# Patient Record
Sex: Female | Born: 1935 | Race: White | Hispanic: No | Smoking: Never smoker
Health system: Southern US, Community
[De-identification: ages and names within clinical notes are randomized; demographics above are authoritative.]

## PROBLEM LIST (undated history)

## (undated) DIAGNOSIS — F0390 Unspecified dementia without behavioral disturbance: Secondary | ICD-10-CM

## (undated) DIAGNOSIS — D649 Anemia, unspecified: Secondary | ICD-10-CM

---

## 2017-03-25 ENCOUNTER — Ambulatory Visit (INDEPENDENT_AMBULATORY_CARE_PROVIDER_SITE_OTHER): Payer: Medicare Other | Admitting: Podiatry

## 2017-03-25 DIAGNOSIS — M2042 Other hammer toe(s) (acquired), left foot: Secondary | ICD-10-CM | POA: Diagnosis not present

## 2017-03-25 DIAGNOSIS — L84 Corns and callosities: Secondary | ICD-10-CM

## 2017-03-25 DIAGNOSIS — M79609 Pain in unspecified limb: Secondary | ICD-10-CM

## 2017-03-25 DIAGNOSIS — B351 Tinea unguium: Secondary | ICD-10-CM | POA: Diagnosis not present

## 2017-03-25 NOTE — Progress Notes (Signed)
  Subjective:  Patient ID: Amanda Goodwin, female    DOB: 1925/05/13,  MRN: [id number]  Chief Complaint  Patient presents with  . Skin Problem    Lesion: L 2nd toe medial side x 6 mo; no pain Tx: corn pads  . debride    B/L debridement -no diabetic   84 y.o. female presents with the above complaint.  Reports painful callus lesion to the inside of the left second toe.  Has tried salicylic acid cream that caused a reaction.  Also complains of elongated leg thickened painful nails.  Normally sees a pedicurist but he says that the nails are too long to cut.  No past medical history on file.  Current Outpatient Medications:  .  aspirin EC 81 MG tablet, Take 81 mg by mouth daily., Disp: , Rfl:   Not on File Review of Systems Objective:  There were no vitals filed for this visit. General AA&O x3. Normal mood and affect.  Vascular Dorsalis pedis and posterior tibial pulses  present 2+ bilaterally  Capillary refill normal to all digits. Pedal hair growth normal.  Neurologic Epicritic sensation grossly present.  Dermatologic Left second DIPJ medial side hyperkeratotic lesion Interspaces clear of maceration. Nails x10 elongated thickened yellow discoloration crumbly texture subungual debris pain to palpation  Orthopedic: MMT 5/5 in dorsiflexion, plantarflexion, inversion, and eversion. Normal joint ROM without pain or crepitus.   Assessment & Plan:  Patient was evaluated and treated and all questions answered.  Onychomycosis with pain -Nails palliatively debrided as below  Procedure: Nail Debridement Type of Debridement: manual, sharp debridement. Instrumentation: Nail nipper, rotary burr. Number of Nails: 10  Hammertoe L 2nd Toe with DIPJ Callus -Lesion palliatively debrided -Corn pad cushion dispensed -Advised against using salicylic acid corn remover    Return if symptoms worsen or fail to improve.

## 2017-12-04 ENCOUNTER — Other Ambulatory Visit: Payer: Self-pay

## 2017-12-04 ENCOUNTER — Inpatient Hospital Stay (HOSPITAL_COMMUNITY): Payer: Medicare Other

## 2017-12-04 ENCOUNTER — Encounter (HOSPITAL_COMMUNITY): Payer: Self-pay | Admitting: Internal Medicine

## 2017-12-04 ENCOUNTER — Inpatient Hospital Stay (HOSPITAL_COMMUNITY)
Admission: AD | Admit: 2017-12-04 | Discharge: 2017-12-06 | DRG: 175 | Disposition: A | Discharge status: Home Health | Payer: Medicare Other | Source: Other Acute Inpatient Hospital | Attending: Internal Medicine | Admitting: Internal Medicine

## 2017-12-04 DIAGNOSIS — M7989 Other specified soft tissue disorders: Secondary | ICD-10-CM

## 2017-12-04 DIAGNOSIS — I361 Nonrheumatic tricuspid (valve) insufficiency: Secondary | ICD-10-CM | POA: Diagnosis not present

## 2017-12-04 DIAGNOSIS — G8929 Other chronic pain: Secondary | ICD-10-CM | POA: Diagnosis present

## 2017-12-04 DIAGNOSIS — R5381 Other malaise: Secondary | ICD-10-CM | POA: Diagnosis present

## 2017-12-04 DIAGNOSIS — I824Z3 Acute embolism and thrombosis of unspecified deep veins of distal lower extremity, bilateral: Secondary | ICD-10-CM | POA: Diagnosis present

## 2017-12-04 DIAGNOSIS — F0390 Unspecified dementia without behavioral disturbance: Secondary | ICD-10-CM | POA: Diagnosis present

## 2017-12-04 DIAGNOSIS — N179 Acute kidney failure, unspecified: Secondary | ICD-10-CM | POA: Diagnosis present

## 2017-12-04 DIAGNOSIS — Z7982 Long term (current) use of aspirin: Secondary | ICD-10-CM

## 2017-12-04 DIAGNOSIS — M25559 Pain in unspecified hip: Secondary | ICD-10-CM | POA: Diagnosis present

## 2017-12-04 DIAGNOSIS — Z833 Family history of diabetes mellitus: Secondary | ICD-10-CM | POA: Diagnosis not present

## 2017-12-04 DIAGNOSIS — J189 Pneumonia, unspecified organism: Secondary | ICD-10-CM | POA: Diagnosis present

## 2017-12-04 DIAGNOSIS — S32402D Unspecified fracture of left acetabulum, subsequent encounter for fracture with routine healing: Secondary | ICD-10-CM

## 2017-12-04 DIAGNOSIS — W19XXXD Unspecified fall, subsequent encounter: Secondary | ICD-10-CM | POA: Diagnosis present

## 2017-12-04 DIAGNOSIS — S32591D Other specified fracture of right pubis, subsequent encounter for fracture with routine healing: Secondary | ICD-10-CM

## 2017-12-04 DIAGNOSIS — I2609 Other pulmonary embolism with acute cor pulmonale: Secondary | ICD-10-CM | POA: Diagnosis not present

## 2017-12-04 DIAGNOSIS — R7989 Other specified abnormal findings of blood chemistry: Secondary | ICD-10-CM | POA: Diagnosis present

## 2017-12-04 DIAGNOSIS — I2699 Other pulmonary embolism without acute cor pulmonale: Secondary | ICD-10-CM | POA: Diagnosis present

## 2017-12-04 DIAGNOSIS — D649 Anemia, unspecified: Secondary | ICD-10-CM | POA: Diagnosis present

## 2017-12-04 HISTORY — DX: Anemia, unspecified: D64.9

## 2017-12-04 HISTORY — DX: Unspecified dementia, unspecified severity, without behavioral disturbance, psychotic disturbance, mood disturbance, and anxiety: F03.90

## 2017-12-04 LAB — COMPREHENSIVE METABOLIC PANEL
ALBUMIN: 2.8 g/dL — AB (ref 3.5–5.0)
ALT: 11 U/L (ref 0–44)
AST: 29 U/L (ref 15–41)
Alkaline Phosphatase: 172 U/L — ABNORMAL HIGH (ref 38–126)
Anion gap: 9 (ref 5–15)
BUN: 17 mg/dL (ref 8–23)
CALCIUM: 8.3 mg/dL — AB (ref 8.9–10.3)
CHLORIDE: 106 mmol/L (ref 98–111)
CO2: 24 mmol/L (ref 22–32)
CREATININE: 1.19 mg/dL — AB (ref 0.44–1.00)
GFR calc non Af Amer: 40 mL/min — ABNORMAL LOW (ref >60)
GFR, EST AFRICAN AMERICAN: 47 mL/min — AB (ref >60)
Glucose, Bld: 116 mg/dL — ABNORMAL HIGH (ref 70–99)
Potassium: 3.4 mmol/L — ABNORMAL LOW (ref 3.5–5.1)
SODIUM: 139 mmol/L (ref 135–145)
TOTAL PROTEIN: 5.9 g/dL — AB (ref 6.5–8.1)
Total Bilirubin: 0.9 mg/dL (ref 0.3–1.2)

## 2017-12-04 LAB — INFLUENZA PANEL BY PCR (TYPE A & B)
Influenza A By PCR: NEGATIVE
Influenza B By PCR: NEGATIVE

## 2017-12-04 LAB — TROPONIN I
Troponin I: 0.06 ng/mL (ref <0.03)
Troponin I: 0.04 ng/mL (ref <0.03)

## 2017-12-04 LAB — CBC
HEMATOCRIT: 32.6 % — AB (ref 36.0–46.0)
HEMOGLOBIN: 10.1 g/dL — AB (ref 12.0–15.0)
MCH: 28.5 pg (ref 26.0–34.0)
MCHC: 31 g/dL (ref 30.0–36.0)
MCV: 92.1 fL (ref 80.0–100.0)
Platelets: 241 10*3/uL (ref 150–400)
RBC: 3.54 MIL/uL — AB (ref 3.87–5.11)
RDW: 14.3 % (ref 11.5–15.5)
WBC: 17.4 10*3/uL — ABNORMAL HIGH (ref 4.0–10.5)
nRBC: 0 % (ref 0.0–0.2)

## 2017-12-04 LAB — CBC WITH DIFFERENTIAL/PLATELET
ABS IMMATURE GRANULOCYTES: 0.23 10*3/uL — AB (ref 0.00–0.07)
BASOS ABS: 0.1 10*3/uL (ref 0.0–0.1)
BASOS PCT: 0 %
Eosinophils Absolute: 0 10*3/uL (ref 0.0–0.5)
Eosinophils Relative: 0 %
HCT: 34 % — ABNORMAL LOW (ref 36.0–46.0)
Hemoglobin: 10.3 g/dL — ABNORMAL LOW (ref 12.0–15.0)
IMMATURE GRANULOCYTES: 1 %
Lymphocytes Relative: 10 %
Lymphs Abs: 2.1 10*3/uL (ref 0.7–4.0)
MCH: 28.1 pg (ref 26.0–34.0)
MCHC: 30.3 g/dL (ref 30.0–36.0)
MCV: 92.6 fL (ref 80.0–100.0)
MONO ABS: 1.5 10*3/uL — AB (ref 0.1–1.0)
Monocytes Relative: 7 %
NEUTROS PCT: 82 %
NRBC: 0 % (ref 0.0–0.2)
Neutro Abs: 17.9 10*3/uL — ABNORMAL HIGH (ref 1.7–7.7)
PLATELETS: 251 10*3/uL (ref 150–400)
RBC: 3.67 MIL/uL — AB (ref 3.87–5.11)
RDW: 14.3 % (ref 11.5–15.5)
WBC: 21.8 10*3/uL — AB (ref 4.0–10.5)

## 2017-12-04 LAB — BASIC METABOLIC PANEL
Anion gap: 8 (ref 5–15)
BUN: 13 mg/dL (ref 8–23)
CHLORIDE: 106 mmol/L (ref 98–111)
CO2: 25 mmol/L (ref 22–32)
Calcium: 8.4 mg/dL — ABNORMAL LOW (ref 8.9–10.3)
Creatinine, Ser: 0.95 mg/dL (ref 0.44–1.00)
GFR calc Af Amer: >60 mL/min (ref >60)
GFR calc non Af Amer: 53 mL/min — ABNORMAL LOW (ref >60)
Glucose, Bld: 94 mg/dL (ref 70–99)
POTASSIUM: 3.6 mmol/L (ref 3.5–5.1)
Sodium: 139 mmol/L (ref 135–145)

## 2017-12-04 LAB — HEPARIN LEVEL (UNFRACTIONATED)
HEPARIN UNFRACTIONATED: 0.31 [IU]/mL (ref 0.30–0.70)
HEPARIN UNFRACTIONATED: 0.26 [IU]/mL — AB (ref 0.30–0.70)

## 2017-12-04 LAB — ECHOCARDIOGRAM COMPLETE
Height: 65 in
WEIGHTICAEL: 2370.39 [oz_av]

## 2017-12-04 LAB — STREP PNEUMONIAE URINARY ANTIGEN: Strep Pneumo Urinary Antigen: NEGATIVE

## 2017-12-04 LAB — TYPE AND SCREEN
ABO/RH(D): O POS
ANTIBODY SCREEN: NEGATIVE

## 2017-12-04 LAB — MRSA PCR SCREENING: MRSA by PCR: NEGATIVE

## 2017-12-04 LAB — ABO/RH: ABO/RH(D): O POS

## 2017-12-04 MED ORDER — HEPARIN (PORCINE) 25000 UT/250ML-% IV SOLN
1300.0000 [IU]/h | INTRAVENOUS | Status: DC
  Administered 2017-12-04: 1000 [IU]/h via INTRAVENOUS
  Administered 2017-12-05: 1300 [IU]/h via INTRAVENOUS
  Filled 2017-12-04 (×2): qty 250

## 2017-12-04 MED ORDER — MORPHINE SULFATE (PF) 2 MG/ML IV SOLN
0.5000 mg | Freq: Once | INTRAVENOUS | Status: AC
  Administered 2017-12-04: 0.5 mg via INTRAVENOUS
  Filled 2017-12-04: qty 1

## 2017-12-04 MED ORDER — SODIUM CHLORIDE 0.9 % IV SOLN
500.0000 mg | INTRAVENOUS | Status: DC
  Administered 2017-12-04 – 2017-12-05 (×2): 500 mg via INTRAVENOUS
  Filled 2017-12-04 (×2): qty 500

## 2017-12-04 MED ORDER — SODIUM CHLORIDE 0.9 % IV SOLN
1.0000 g | INTRAVENOUS | Status: DC
  Administered 2017-12-04 – 2017-12-05 (×2): 1 g via INTRAVENOUS
  Filled 2017-12-04 (×2): qty 10

## 2017-12-04 MED ORDER — ACETAMINOPHEN 325 MG PO TABS: 650.0000 mg | ORAL_TABLET | Freq: Four times a day (QID) | ORAL | Status: DC | PRN

## 2017-12-04 MED ORDER — ONDANSETRON HCL 4 MG PO TABS: 4.0000 mg | ORAL_TABLET | Freq: Four times a day (QID) | ORAL | Status: DC | PRN

## 2017-12-04 MED ORDER — ONDANSETRON HCL 4 MG/2ML IJ SOLN: 4.0000 mg | Freq: Four times a day (QID) | INTRAMUSCULAR | Status: DC | PRN

## 2017-12-04 MED ORDER — ACETAMINOPHEN 650 MG RE SUPP: 650.0000 mg | Freq: Four times a day (QID) | RECTAL | Status: DC | PRN

## 2017-12-04 MED ORDER — ASPIRIN EC 81 MG PO TBEC
81.0000 mg | DELAYED_RELEASE_TABLET | Freq: Every day | ORAL | Status: DC
  Administered 2017-12-05: 81 mg via ORAL
  Filled 2017-12-04 (×2): qty 1

## 2017-12-04 MED ORDER — SODIUM CHLORIDE 0.9 % IV SOLN
INTRAVENOUS | Status: AC
  Administered 2017-12-04: 07:00:00 via INTRAVENOUS

## 2017-12-04 NOTE — Progress Notes (Signed)
BLE venous duplex prelim: intramuscular DVT bilateral soleal veins.   Amanda Goodwin, RDMS, RVT

## 2017-12-04 NOTE — Progress Notes (Signed)
ANTICOAGULATION CONSULT NOTE - Follow-up Consult  Pharmacy Consult for Heparin Indication: pulmonary embolus  No Known Allergies  Patient Measurements: Height: 5\' 5"  (165.1 cm) Weight: 148 lb 2.4 oz (67.2 kg) IBW/kg (Calculated) : 57  Vital Signs: Temp: 99.7 F (37.6 C) (11/27 0745) Temp Source: Axillary (11/27 0745) BP: 112/50 (11/27 0745) Pulse Rate: 81 (11/27 0745)  Labs (at [facility]): WBC  20.6 Hgb  11.9 Hct  36.4 Plt  281  SCr  1.5 Recent Labs    12/04/17 0224 12/04/17 1034  HGB 10.3* 10.1*  HCT 34.0* 32.6*  PLT 251 241  HEPARINUNFRC  --  0.31  CREATININE 1.19* 0.95  TROPONINI 0.06* 0.04*    Estimated Creatinine Clearance: 35.4 mL/min (by C-G formula based on SCr of 0.95 mg/dL).   Medical History: HTN  GERD  Asthma  Dementia Recent acetabular fracture 11/19/17  Medications:  Lyrica  Prilosec ASA  Advair  Levaquin  Prednisone  Mobic    Assessment: 84 y.o. female transferred [facility] with PE.  Heparin level is therapeutic on 1000 units/hr.  No bleeding noted.  Goal of Therapy:  Heparin level 0.3-0.7 units/ml Monitor platelets by anticoagulation protocol: Yes   Plan:  Continue heparin 1000 units/hr. Check heparin level at 1600 for confirmation. Daily heparin level and CBC  Creedence Kunesh, Pharm.D., BCPS Clinical Pharmacist Pager: [phone] Clinical phone for 12/04/2017 from 8:30-4:00 is [phone].  **Pharmacist phone directory can now be found on [email or web address] ([email or web address]).  Listed under [facility].  12/04/2017 1:42 PM

## 2017-12-04 NOTE — Progress Notes (Signed)
CRITICAL VALUE ALERT  Critical Value: Troponin 0.06  Date & Time Notied: 12/04/17 0403  Provider Notified: Dr. Toniann FailKakrakandy  Orders Received/Actions taken: no new orders at this time

## 2017-12-04 NOTE — Progress Notes (Addendum)
ANTICOAGULATION CONSULT NOTE - Follow-up Consult  Pharmacy Consult for Heparin Indication: pulmonary embolus/DVT  No Known Allergies  Patient Measurements: Height: 5\' 5"  (165.1 cm) Weight: 148 lb 2.4 oz (67.2 kg) IBW/kg (Calculated) : 57  Heparin dosing weight - 67.2 kg  Vital Signs: Temp: 100.8 F (38.2 C) (11/27 1709) Temp Source: Oral (11/27 1709) BP: 98/49 (11/27 1709) Pulse Rate: 94 (11/27 1709)  Labs (at [facility]): WBC  20.6 Hgb  11.9 Hct  36.4 Plt  281  SCr  1.5 Recent Labs    12/04/17 0224 12/04/17 1034 12/04/17 1644  HGB 10.3* 10.1*  --   HCT 34.0* 32.6*  --   PLT 251 241  --   HEPARINUNFRC  --  0.31 0.26*  CREATININE 1.19* 0.95  --   TROPONINI 0.06* 0.04*  --     Estimated Creatinine Clearance: 35.4 mL/min (by C-G formula based on SCr of 0.95 mg/dL).   Medical History: HTN  GERD  Asthma  Dementia Recent acetabular fracture 11/19/17  Medications:  Lyrica  Prilosec ASA  Advair  Levaquin  Prednisone  Mobic    Assessment: 84 y.o. female transferred from [facility] with PE/DVT. Heparin level now slightly low at 0.26 this afternoon. Hg down to 10.1, plt stable wnl. No bleeding or issues with infusion per discussion with RN.  Goal of Therapy:  Heparin level 0.3-0.7 units/ml Monitor platelets by anticoagulation protocol: Yes   Plan:  Increase heparin to 1150 units/hr 6h heparin level Monitor daily heparin level and CBC, s/sx bleeding Plan is to transition to apixaban when appropriate per MD note  Babs BertinHaley Meeghan Skipper, PharmD, BCPS Clinical Pharmacist Clinical phone [phone] Please check AMION for all [facility] contact numbers 12/04/2017 5:29 PM

## 2017-12-04 NOTE — H&P (Signed)
History and Physical    Amanda PangMinnie Goodwin WNU:[id number] DOB: 09-Dec-1925 DOA: 12/04/2017  PCP: Gordan PaymentGrisso, Greg A., MD  Patient coming from: [facility] was [facility].  Chief Complaint: Shortness of breath.  HPI: Amanda PangMinnie Goodwin is a 84 y.o. female with history of advanced dementia who lives with her daughter recently diagnosed with left acetabular fracture after fall being managed conservatively was noticed to have increasing fever productive cough and dark urine last 2 days.  As per the daughter who provided the history patient was bedbound for last few days and started walking yesterday.  Following which patient started become short of breath with productive cough.  ED Course: In the ER patient had CT angiogram of the chest which showed positive for acute pulmonary embolism within the right descending lobar right upper right lower lobe segmental and subsegmental blood vessels positive for acute PE with CT evidence of right heart strain.  There also was concern for consolidation in the right lung base concerning for pneumonia.  UA showing leukocyte esterase positive with large number of RBCs WBCs and epithelial cells.  Since ED was showing strain pattern patient was [facility].  Patient has remained hemodynamically stable.  Per daughter patient has been complaining of increasing left hip pain after admission.  [facility] showed WBC count of 20.6 hemoglobin 11.9 creatinine 1.5.  Potassium 3.4.  Review of Systems: As per HPI, rest all negative.   Past Medical History:  Diagnosis Date  . Anemia   . Dementia (HCC)     History reviewed. No pertinent surgical history.   reports that she has never smoked. She has never used smokeless tobacco. She reports that she does not drink alcohol. Her drug history is not on file.  No Known Allergies  Family History  Problem Relation Age of Onset  . Diabetes Mellitus II Mother   . Cancer  Father   . Diabetes Mellitus II Sister     Prior to Admission medications   Medication Sig Start Date End Date Taking? Authorizing Provider  aspirin EC 81 MG tablet Take 81 mg by mouth daily.    [provider]    Physical Exam: Vitals:   12/04/17 0149 12/04/17 0408 12/04/17 0423 12/04/17 0432  BP: (!) 113/55 (!) 99/47 (!) 97/47 119/63  Pulse: 89     Resp: 20     Temp: 98.9 F (37.2 C)     TempSrc: Oral     SpO2: 95% 93% 95% 93%  Weight: 67.2 kg     Height: 5\' 5"  (1.651 m)         Constitutional: Moderately built and nourished. Vitals:   12/04/17 0149 12/04/17 0408 12/04/17 0423 12/04/17 0432  BP: (!) 113/55 (!) 99/47 (!) 97/47 119/63  Pulse: 89     Resp: 20     Temp: 98.9 F (37.2 C)     TempSrc: Oral     SpO2: 95% 93% 95% 93%  Weight: 67.2 kg     Height: 5\' 5"  (1.651 m)      Eyes: Anicteric no pallor. ENMT: No discharge from the ears eyes nose or mouth. Neck: No mass or.  No neck rigidity.  No JVD appreciated. Respiratory: No rhonchi or crepitations. Cardiovascular: S1-S2 heard no murmurs appreciated. Abdomen: Soft nontender bowel sounds present. Musculoskeletal: No edema. Skin: No rash. Neurologic: Patient is mildly drowsy after receiving morphine for pain.  Need to recheck neuro exam after patient is more alert awake.  Psychiatric: Mildly drowsy after getting morphine.   Labs on Admission: I have personally reviewed following labs and imaging studies  CBC: Recent Labs  Lab 12/04/17 0224  WBC 21.8*  NEUTROABS 17.9*  HGB 10.3*  HCT 34.0*  MCV 92.6  PLT 251   Basic Metabolic Panel: Recent Labs  Lab 12/04/17 0224  NA 139  K 3.4*  CL 106  CO2 24  GLUCOSE 116*  BUN 17  CREATININE 1.19*  CALCIUM 8.3*   GFR: Estimated Creatinine Clearance: 28.3 mL/min (A) (by C-G formula based on SCr of 1.19 mg/dL (H)). Liver Function Tests: Recent Labs  Lab 12/04/17 0224  AST 29  ALT 11  ALKPHOS 172*  BILITOT 0.9  PROT 5.9*  ALBUMIN 2.8*    No results for input(s): LIPASE, AMYLASE in the last 168 hours. No results for input(s): AMMONIA in the last 168 hours. Coagulation Profile: No results for input(s): INR, PROTIME in the last 168 hours. Cardiac Enzymes: Recent Labs  Lab 12/04/17 0224  TROPONINI 0.06*   BNP (last 3 results) No results for input(s): PROBNP in the last 8760 hours. HbA1C: No results for input(s): HGBA1C in the last 72 hours. CBG: No results for input(s): GLUCAP in the last 168 hours. Lipid Profile: No results for input(s): CHOL, HDL, LDLCALC, TRIG, CHOLHDL, LDLDIRECT in the last 72 hours. Thyroid Function Tests: No results for input(s): TSH, T4TOTAL, FREET4, T3FREE, THYROIDAB in the last 72 hours. Anemia Panel: No results for input(s): VITAMINB12, FOLATE, FERRITIN, TIBC, IRON, RETICCTPCT in the last 72 hours. Urine analysis: No results found for: COLORURINE, APPEARANCEUR, LABSPEC, PHURINE, GLUCOSEU, HGBUR, BILIRUBINUR, KETONESUR, PROTEINUR, UROBILINOGEN, NITRITE, LEUKOCYTESUR Sepsis Labs: @LABRCNTIP (procalcitonin:4,lacticidven:4) ) Recent Results (from the past 240 hour(s))  MRSA PCR Screening     Status: None   Collection Time: 12/04/17  2:15 AM  Result Value Ref Range Status   MRSA by PCR NEGATIVE NEGATIVE Final    Comment:        The GeneXpert MRSA Assay (FDA approved for NASAL specimens only), is one component of a comprehensive MRSA colonization surveillance program. It is not intended to diagnose MRSA infection nor to guide or monitor treatment for MRSA infections. Performed at [facility], [address] [place], [place] [address]      Radiological Exams on Admission: No results found.  EKG: Independently reviewed.  Normal sinus rhythm.  Assessment/Plan Principal Problem:   Acute pulmonary embolism (HCC) Active Problems:   CAP (community acquired pneumonia)   Hip pain   Normocytic normochromic anemia   Dementia (HCC)   Acute pulmonary embolus (HCC)     1. Acute pulmonary embolism with strain pattern hemodynamically stable likely provoked from recent fracture and bedrest -patient has been placed on heparin.  We will cycle cardiac markers.  Discussed with pulmonary critical care about troponin mildly positive but patient is hemodynamically stable.  If patient's troponin X to increase or patient becomes hypotensive will need to reconsult pulmonary critical care for possible thrombolysis.  Check 2D echo and Dopplers of the lower extremity. 2. Increasing pain of the left hip after recent fracture will check CT pelvis. 3. Community-acquired pneumonia placed on ceftriaxone and Zithromax.  Check urine for Legionella strep antigen influenza PCR. 4. Anemia follow CBC. 5. Advanced dementia with no behavioral disturbances. 6. Chronic pain on Lyrica. 7. Renal failure probably chronic do not have any baseline creatinine to compare.  Follow metabolic panel.   DVT prophylaxis: Heparin. Code Status: Full code. Family Communication: Patient's daughter. Disposition Plan:  Home. Consults called: None.  Discussed with pulmonary critical care. Admission status: Inpatient.   Eduard Clos MD [facility] Pager [phone].  If 7PM-7AM, please contact night-coverage [email or web address] Password [facility]  12/04/2017, 4:58 AM

## 2017-12-04 NOTE — Progress Notes (Signed)
  Echocardiogram 2D Echocardiogram has been performed.  Amanda SkeenVijay  [name] 12/04/2017, 12:14 PM

## 2017-12-04 NOTE — Progress Notes (Signed)
PROGRESS NOTE        PATIENT DETAILS Name: Amanda Goodwin Age: 84 y.o. Sex: female Date of Birth: 03-25-1925 Admit Date: 12/04/2017 Admitting Physician Eduard Clos, MD [facility]:QIONGE, Evlyn Courier., MD  Brief Narrative: Patient is a 84 y.o. female history of dementia, recent left acetabular fracture managed conservatively-presenting with shortness of breath, found to have a pulmonary embolism-started on anticoagulation admitted to the hospitalist service.  See below for further details  Subjective: Answers all my questions appropriately this morning-denies any chest pain or shortness of breath.  Lying comfortably in bed.  Assessment/Plan: Venous thromboembolism-PE with bilateral lower extremity DVT: Likely provoked-probably secondary to decreased mobility following left acetabular fracture.  Although CT angiogram chest showed possible the strain-she is stable hemodynamically and does not have any clinical features suggestive of RV strain.  Plans are to continue IV heparin-and transition to Eliquis in the next day or so.  Await echocardiogram.  Community-acquired pneumonia: Afebrile-does not appear acutely sick-continue Rocephin and Zithromax.  Influenza PCR negative.  Mild AKI: Likely hemodynamically mediated-resolved.  Minimally elevated troponin: Trend is flat and not suggestive of ACS.  Likely secondary to PE.  No further work-up required  Recent left acetabular fracture/left pubic fracture: Supportive care-ambulate with PT.  Advanced dementia: Pleasantly confused.  DVT Prophylaxis: Full dose anticoagulation with Heparin  Code Status: Full code  Family Communication: None at bedside  Disposition Plan: Remain inpatient  Antimicrobial agents: Anti-infectives (From admission, onward)   Start     Dose/Rate Route Frequency Ordered Stop   12/04/17 1800  cefTRIAXone (ROCEPHIN) 1 g in sodium chloride 0.9 % 100 mL IVPB     1 g 200 mL/hr over 30  Minutes Intravenous Every 24 hours 12/04/17 0458 12/11/17 1759   12/04/17 1800  azithromycin (ZITHROMAX) 500 mg in sodium chloride 0.9 % 250 mL IVPB     500 mg 250 mL/hr over 60 Minutes Intravenous Every 24 hours 12/04/17 0458 12/11/17 1759      Procedures: None  CONSULTS:  None  Time spent: 25- minutes-Greater than 50% of this time was spent in counseling, explanation of diagnosis, planning of further management, and coordination of care.  MEDICATIONS: Scheduled Meds: . aspirin EC  81 mg Oral Daily   Continuous Infusions: . sodium chloride 75 mL/hr at 12/04/17 0646  . azithromycin    . cefTRIAXone (ROCEPHIN)  IV    . heparin 1,000 Units/hr (12/04/17 0303)   PRN Meds:.acetaminophen **OR** acetaminophen, ondansetron **OR** ondansetron (ZOFRAN) IV   PHYSICAL EXAM: Vital signs: Vitals:   12/04/17 0423 12/04/17 0432 12/04/17 0630 12/04/17 0745  BP: (!) 97/47 119/63 132/62 (!) 112/50  Pulse:    81  Resp:    15  Temp:    99.7 F (37.6 C)  TempSrc:    Axillary  SpO2: 95% 93% 100% 93%  Weight:      Height:       Filed Weights   12/04/17 0149  Weight: 67.2 kg   Body mass index is 24.65 kg/m.   General appearance :Awake, alert, not in any distress.  Eyes:.Pink conjunctiva HEENT: Atraumatic and Normocephalic Neck: supple, no JVD. Resp:Good air entry bilaterally, no added sounds  CVS: S1 S2 regular, no murmurs.  GI: Bowel sounds present, Non tender and not distended with no gaurding, rigidity or rebound.No organomegaly Extremities: B/L Lower Ext shows no edema, both legs are  warm to touch Neurology:  Non focal Musculoskeletal:No digital cyanosis Skin:No Rash, warm and dry Wounds:N/A  I have personally reviewed following labs and imaging studies  LABORATORY DATA: CBC: Recent Labs  Lab 12/04/17 0224 12/04/17 1034  WBC 21.8* 17.4*  NEUTROABS 17.9*  --   HGB 10.3* 10.1*  HCT 34.0* 32.6*  MCV 92.6 92.1  PLT 251 241    Basic Metabolic Panel: Recent Labs    Lab 12/04/17 0224 12/04/17 1034  NA 139 139  K 3.4* 3.6  CL 106 106  CO2 24 25  GLUCOSE 116* 94  BUN 17 13  CREATININE 1.19* 0.95  CALCIUM 8.3* 8.4*    GFR: Estimated Creatinine Clearance: 35.4 mL/min (by C-G formula based on SCr of 0.95 mg/dL).  Liver Function Tests: Recent Labs  Lab 12/04/17 0224  AST 29  ALT 11  ALKPHOS 172*  BILITOT 0.9  PROT 5.9*  ALBUMIN 2.8*   No results for input(s): LIPASE, AMYLASE in the last 168 hours. No results for input(s): AMMONIA in the last 168 hours.  Coagulation Profile: No results for input(s): INR, PROTIME in the last 168 hours.  Cardiac Enzymes: Recent Labs  Lab 12/04/17 0224 12/04/17 1034  TROPONINI 0.06* 0.04*    BNP (last 3 results) No results for input(s): PROBNP in the last 8760 hours.  HbA1C: No results for input(s): HGBA1C in the last 72 hours.  CBG: No results for input(s): GLUCAP in the last 168 hours.  Lipid Profile: No results for input(s): CHOL, HDL, LDLCALC, TRIG, CHOLHDL, LDLDIRECT in the last 72 hours.  Thyroid Function Tests: No results for input(s): TSH, T4TOTAL, FREET4, T3FREE, THYROIDAB in the last 72 hours.  Anemia Panel: No results for input(s): VITAMINB12, FOLATE, FERRITIN, TIBC, IRON, RETICCTPCT in the last 72 hours.  Urine analysis: No results found for: COLORURINE, APPEARANCEUR, LABSPEC, PHURINE, GLUCOSEU, HGBUR, BILIRUBINUR, KETONESUR, PROTEINUR, UROBILINOGEN, NITRITE, LEUKOCYTESUR  Sepsis Labs: Lactic Acid, Venous No results found for: LATICACIDVEN  MICROBIOLOGY: Recent Results (from the past 240 hour(s))  MRSA PCR Screening     Status: None   Collection Time: 12/04/17  2:15 AM  Result Value Ref Range Status   MRSA by PCR NEGATIVE NEGATIVE Final    Comment:        The GeneXpert MRSA Assay (FDA approved for NASAL specimens only), is one component of a comprehensive MRSA colonization surveillance program. It is not intended to diagnose MRSA infection nor to guide  or monitor treatment for MRSA infections. Performed at [facility], [address] [place], [place] [address]     RADIOLOGY STUDIES/RESULTS: Ct Pelvis Wo Contrast  Result Date: 12/04/2017 CLINICAL DATA:  Pelvic fractures, pelvic pain EXAM: CT PELVIS WITHOUT CONTRAST TECHNIQUE: Multidetector CT imaging of the pelvis was performed following the standard protocol without intravenous contrast. COMPARISON:  11/19/2017 FINDINGS: Urinary Tract: The ureters are opacified with contrast material and decompressed, normal caliber. Urinary bladder filled with contrast and appears normal. Bowel: Normal appendix. Visualized large and small bowel unremarkable. Vascular/Lymphatic: Diffuse aortic and iliac calcifications. No aneurysm or adenopathy. Reproductive:  Uterus and adnexa unremarkable.  No mass. Other:  No free fluid or free air. Musculoskeletal: Again noted are the acute fractures in the left inferior pubic ramus, left lateral superior pubic ramus near the left acetabulum, and left sacral ala. Old right sacral and right inferior pubic rami fractures again noted. Appearance is stable with compared to recent study. The previously seen hematoma in the left pelvis has nearly completely resolved. No new  acute bony abnormality. IMPRESSION: Stable appearance of the acute fractures in the left sacrum, left lateral superior pubic ramus near the acetabulum, and left inferior pubic ramus fracture. Remote fractures in the right sacrum and right inferior pubic ramus. No new bony abnormality. Near complete resolution of the left pelvic hematoma. Electronically Signed   By: Charlett Nose M.D.   On: 12/04/2017 09:31   Vas [place] Lower Extremity Venous (dvt)  Result Date: 12/04/2017  Lower Venous Study Indications: Swelling.  Risk Factors: Confirmed PE. Performing Technologist: Farrel Demark RDMS, RVT  Examination Guidelines: A complete evaluation includes B-mode imaging, spectral Doppler, color Doppler, and power Doppler  as needed of all accessible portions of each vessel. Bilateral testing is considered an integral part of a complete examination. Limited examinations for reoccurring indications may be performed as noted.  Right Venous Findings: +---------+---------------+---------+-----------+----------+-------+          CompressibilityPhasicitySpontaneityPropertiesSummary +---------+---------------+---------+-----------+----------+-------+ CFV      Full           Yes      Yes                          +---------+---------------+---------+-----------+----------+-------+ SFJ      Full                                                 +---------+---------------+---------+-----------+----------+-------+ FV Prox  Full                                                 +---------+---------------+---------+-----------+----------+-------+ FV Mid   Full                                                 +---------+---------------+---------+-----------+----------+-------+ FV DistalFull                                                 +---------+---------------+---------+-----------+----------+-------+ PFV      Full                                                 +---------+---------------+---------+-----------+----------+-------+ POP      Full           Yes      Yes                          +---------+---------------+---------+-----------+----------+-------+ PTV      Full                                                 +---------+---------------+---------+-----------+----------+-------+ PERO     Full                                                 +---------+---------------+---------+-----------+----------+-------+  Soleal   None                                         Acute   +---------+---------------+---------+-----------+----------+-------+  Left Venous Findings: +---------+---------------+---------+-----------+----------+-------+           CompressibilityPhasicitySpontaneityPropertiesSummary +---------+---------------+---------+-----------+----------+-------+ CFV      Full           Yes      Yes                          +---------+---------------+---------+-----------+----------+-------+ SFJ      Full                                                 +---------+---------------+---------+-----------+----------+-------+ FV Prox  Full                                                 +---------+---------------+---------+-----------+----------+-------+ FV Mid   Full                                                 +---------+---------------+---------+-----------+----------+-------+ FV DistalFull                                                 +---------+---------------+---------+-----------+----------+-------+ PFV      Full                                                 +---------+---------------+---------+-----------+----------+-------+ POP      Full           Yes      Yes                          +---------+---------------+---------+-----------+----------+-------+ PTV      Full                                                 +---------+---------------+---------+-----------+----------+-------+ PERO     Full                                                 +---------+---------------+---------+-----------+----------+-------+ Soleal   None                                         Acute   +---------+---------------+---------+-----------+----------+-------+    Summary: Right: Findings  consistent with acute deep vein thrombosis involving the right soleal vein. A cystic structure is found in the popliteal fossa. Left: Findings consistent with acute deep vein thrombosis involving the left soleal vein. No cystic structure found in the popliteal fossa.  *See table(s) above for measurements and observations. Electronically signed by Coral Else MD on 12/04/2017 at 10:34:27 AM.    Final       LOS: 0 days   Jeoffrey Massed, MD  [facility]  If 7PM-7AM, please contact night-coverage  Please page via [email or web address] [email or web address]-click on MD name and type text message  12/04/2017, 1:15 PM

## 2017-12-04 NOTE — Progress Notes (Signed)
ANTICOAGULATION CONSULT NOTE - Initial Consult  Pharmacy Consult for Heparin Indication: pulmonary embolus  No Known Allergies  Patient Measurements: Height: 5\' 5"  (165.1 cm) Weight: 148 lb 2.4 oz (67.2 kg) IBW/kg (Calculated) : 57  Vital Signs: Temp: 98.9 F (37.2 C) (11/27 0149) Temp Source: Oral (11/27 0149) BP: 113/55 (11/27 0149) Pulse Rate: 89 (11/27 0149)  [facility] WBC  20.6 Hgb  11.9 Hct  36.4 Plt  281  SCr  1.5 No results for input(s): HGB, HCT, PLT, APTT, LABPROT, INR, HEPARINUNFRC, HEPRLOWMOCWT, CREATININE, CKTOTAL, CKMB, TROPONINI in the last 72 hours.  CrCl cannot be calculated (No successful lab value found.).   Medical History: Htn  GERD  Asthma  Dementia Recent acetabular fracture 11/19/17  Medications:  Lyrica  Prilosec ASA  Advair  Levaquin  Prednisone  Mobic    Assessment: 84 y.o. female with PE for heparin.  Heparin 3000 units IV bolus, 1000 units/hr started at [facility] at 2200  Goal of Therapy:  Heparin level 0.3-0.7 units/ml Monitor platelets by anticoagulation protocol: Yes   Plan:  Continue heparin 1000 units/hr Follow-up am labs.   Sarrah Fiorenza, Gary FleetGregory Vernon 12/04/2017,2:44 AM

## 2017-12-05 LAB — CBC
HCT: 30.2 % — ABNORMAL LOW (ref 36.0–46.0)
Hemoglobin: 9.7 g/dL — ABNORMAL LOW (ref 12.0–15.0)
MCH: 29.2 pg (ref 26.0–34.0)
MCHC: 32.1 g/dL (ref 30.0–36.0)
MCV: 91 fL (ref 80.0–100.0)
PLATELETS: 229 10*3/uL (ref 150–400)
RBC: 3.32 MIL/uL — ABNORMAL LOW (ref 3.87–5.11)
RDW: 14.3 % (ref 11.5–15.5)
WBC: 7.5 10*3/uL (ref 4.0–10.5)
nRBC: 0 % (ref 0.0–0.2)

## 2017-12-05 LAB — HEPARIN LEVEL (UNFRACTIONATED)
HEPARIN UNFRACTIONATED: 0.43 [IU]/mL (ref 0.30–0.70)
HEPARIN UNFRACTIONATED: 0.26 [IU]/mL — AB (ref 0.30–0.70)

## 2017-12-05 LAB — LEGIONELLA PNEUMOPHILA SEROGP 1 UR AG: L. pneumophila Serogp 1 Ur Ag: NEGATIVE

## 2017-12-05 MED ORDER — APIXABAN 5 MG PO TABS: 5.0000 mg | ORAL_TABLET | Freq: Two times a day (BID) | ORAL | Status: DC

## 2017-12-05 MED ORDER — APIXABAN 5 MG PO TABS
10.0000 mg | ORAL_TABLET | Freq: Two times a day (BID) | ORAL | Status: DC
  Administered 2017-12-05 – 2017-12-06 (×3): 10 mg via ORAL
  Filled 2017-12-05 (×3): qty 2

## 2017-12-05 NOTE — Progress Notes (Signed)
ANTICOAGULATION CONSULT NOTE  Pharmacy Consult for Eliquis Indication: pulmonary embolus/DVT  No Known Allergies  Patient Measurements: Height: 5\' 5"  (165.1 cm) Weight: 149 lb (67.6 kg) IBW/kg (Calculated) : 57   Vital Signs: Temp: 99.5 F (37.5 C) (11/28 0441) Temp Source: Oral (11/28 0441) BP: 112/67 (11/28 0342) Pulse Rate: 77 (11/28 0342)  Labs: Recent Labs    12/04/17 0224 12/04/17 1034 12/04/17 1644 12/05/17 0019  HGB 10.3* 10.1*  --   --   HCT 34.0* 32.6*  --   --   PLT 251 241  --   --   HEPARINUNFRC  --  0.31 0.26* 0.26*  CREATININE 1.19* 0.95  --   --   TROPONINI 0.06* 0.04*  --   --     Estimated Creatinine Clearance: 35.4 mL/min (by C-G formula based on SCr of 0.95 mg/dL).  Assessment: 84 y.o. female with PE/DVT for Eliquis   Plan:  D/C heparin now Eliquis 10 mg BID x 7 days, then ELiquis 5 mg BID  Geannie RisenGreg Yahia Bottger, PharmD, BCPS  12/05/2017 7:40 AM

## 2017-12-05 NOTE — Progress Notes (Signed)
PROGRESS NOTE        PATIENT DETAILS Name: Amanda Goodwin Age: 84 y.o. Sex: female Date of Birth: 07-27-1925 Admit Date: 12/04/2017 Admitting Physician Eduard Clos, MD [id number], Evlyn Courier., MD  Brief Narrative: Patient is a 84 y.o. female history of dementia, recent left acetabular fracture managed conservatively-presenting with shortness of breath, found to have a pulmonary embolism-started on anticoagulation admitted to the hospitalist service.  See below for further details  Subjective: No major events overnight per nursing staff-patient denies any chest pain or shortness of breath.  Assessment/Plan: Venous thromboembolism-PE with bilateral lower extremity DVT: Likely provoked-probably secondary to decreased mobility following left acetabular fracture.  Although CT angiogram of the chest shows possible RV strain-patient has no clinical features of RV failure.  2D echocardiogram does not show RV failure as well.  Lower extremity Dopplers does confirm bilateral lower extremity DVT.  Since stable since admission-stop IV heparin and transition to Eliquis.  Community-acquired pneumonia: Afebrile-leukocytosis has resolved-continue Rocephin/Zithromax-we will switch to oral antimicrobial agents on discharge.  Influenza PCR negative.  This could be pulmonary infarction as well.  Mild AKI: Likely hemodynamically mediated-resolved.  Minimally elevated troponin: Trend is flat and not suggestive of ACS.  Likely secondary to PE.  No further work-up required  Recent left acetabular fracture/left pubic fracture: Supportive care-ambulate with physical therapy.    Advanced dementia: Pleasantly confused this morning-but able to answer most of my questions appropriately.  DVT Prophylaxis: Full dose anticoagulation with Heparin-switching to Eliquis  Code Status: Full code  Family Communication: None at bedside-attempted to call patient's niece-number in facesheet  apparently is the wrong number.  Disposition Plan: Remain inpatient  Antimicrobial agents: Anti-infectives (From admission, onward)   Start     Dose/Rate Route Frequency Ordered Stop   12/04/17 1800  cefTRIAXone (ROCEPHIN) 1 g in sodium chloride 0.9 % 100 mL IVPB     1 g 200 mL/hr over 30 Minutes Intravenous Every 24 hours 12/04/17 0458 12/11/17 1759   12/04/17 1800  azithromycin (ZITHROMAX) 500 mg in sodium chloride 0.9 % 250 mL IVPB     500 mg 250 mL/hr over 60 Minutes Intravenous Every 24 hours 12/04/17 0458 12/11/17 1759      Procedures: None  CONSULTS:  None  Time spent: 25- minutes-Greater than 50% of this time was spent in counseling, explanation of diagnosis, planning of further management, and coordination of care.  MEDICATIONS: Scheduled Meds: . apixaban  10 mg Oral BID   Followed by  . [START ON 12/12/2017] apixaban  5 mg Oral BID  . aspirin EC  81 mg Oral Daily   Continuous Infusions: . azithromycin Stopped (12/04/17 1937)  . cefTRIAXone (ROCEPHIN)  IV Stopped (12/04/17 1836)   PRN Meds:.acetaminophen **OR** acetaminophen, ondansetron **OR** ondansetron (ZOFRAN) IV   PHYSICAL EXAM: Vital signs: Vitals:   12/05/17 0342 12/05/17 0418 12/05/17 0441 12/05/17 0744  BP: 112/67   (!) 145/64  Pulse: 77   75  Resp: 20     Temp: 98.3 F (36.8 C)  99.5 F (37.5 C) 98.2 F (36.8 C)  TempSrc: Oral  Oral Oral  SpO2: 95%   97%  Weight:  67.6 kg    Height:       Filed Weights   12/04/17 0149 12/05/17 0418  Weight: 67.2 kg 67.6 kg   Body mass index is 24.79 kg/m.  General appearance:Awake, alert, not in any distress.  Eyes:no scleral icterus. HEENT: Atraumatic and Normocephalic Neck: supple, no JVD. Resp:Good air entry bilaterally,no rales or rhonchi CVS: S1 S2 regular, no murmurs.  GI: Bowel sounds present, Non tender and not distended with no gaurding, rigidity or rebound. Extremities: B/L Lower Ext shows no edema, both legs are warm to  touch Neurology:  Non focal Musculoskeletal:No digital cyanosis Skin:No Rash, warm and dry Wounds:N/A  I have personally reviewed following labs and imaging studies  LABORATORY DATA: CBC: Recent Labs  Lab 12/04/17 0224 12/04/17 1034 12/05/17 0717  WBC 21.8* 17.4* 7.5  NEUTROABS 17.9*  --   --   HGB 10.3* 10.1* 9.7*  HCT 34.0* 32.6* 30.2*  MCV 92.6 92.1 91.0  PLT 251 241 229    Basic Metabolic Panel: Recent Labs  Lab 12/04/17 0224 12/04/17 1034  NA 139 139  K 3.4* 3.6  CL 106 106  CO2 24 25  GLUCOSE 116* 94  BUN 17 13  CREATININE 1.19* 0.95  CALCIUM 8.3* 8.4*    GFR: Estimated Creatinine Clearance: 35.4 mL/min (by C-G formula based on SCr of 0.95 mg/dL).  Liver Function Tests: Recent Labs  Lab 12/04/17 0224  AST 29  ALT 11  ALKPHOS 172*  BILITOT 0.9  PROT 5.9*  ALBUMIN 2.8*   No results for input(s): LIPASE, AMYLASE in the last 168 hours. No results for input(s): AMMONIA in the last 168 hours.  Coagulation Profile: No results for input(s): INR, PROTIME in the last 168 hours.  Cardiac Enzymes: Recent Labs  Lab 12/04/17 0224 12/04/17 1034  TROPONINI 0.06* 0.04*    BNP (last 3 results) No results for input(s): PROBNP in the last 8760 hours.  HbA1C: No results for input(s): HGBA1C in the last 72 hours.  CBG: No results for input(s): GLUCAP in the last 168 hours.  Lipid Profile: No results for input(s): CHOL, HDL, LDLCALC, TRIG, CHOLHDL, LDLDIRECT in the last 72 hours.  Thyroid Function Tests: No results for input(s): TSH, T4TOTAL, FREET4, T3FREE, THYROIDAB in the last 72 hours.  Anemia Panel: No results for input(s): VITAMINB12, FOLATE, FERRITIN, TIBC, IRON, RETICCTPCT in the last 72 hours.  Urine analysis: No results found for: COLORURINE, APPEARANCEUR, LABSPEC, PHURINE, GLUCOSEU, HGBUR, BILIRUBINUR, KETONESUR, PROTEINUR, UROBILINOGEN, NITRITE, LEUKOCYTESUR  Sepsis Labs: Lactic Acid, Venous No results found for:  LATICACIDVEN  MICROBIOLOGY: Recent Results (from the past 240 hour(s))  MRSA PCR Screening     Status: None   Collection Time: 12/04/17  2:15 AM  Result Value Ref Range Status   MRSA by PCR NEGATIVE NEGATIVE Final    Comment:        The GeneXpert MRSA Assay (FDA approved for NASAL specimens only), is one component of a comprehensive MRSA colonization surveillance program. It is not intended to diagnose MRSA infection nor to guide or monitor treatment for MRSA infections. Performed at [facility], [address] [place], [place] [address]     RADIOLOGY STUDIES/RESULTS: Ct Pelvis Wo Contrast  Result Date: 12/04/2017 CLINICAL DATA:  Pelvic fractures, pelvic pain EXAM: CT PELVIS WITHOUT CONTRAST TECHNIQUE: Multidetector CT imaging of the pelvis was performed following the standard protocol without intravenous contrast. COMPARISON:  11/19/2017 FINDINGS: Urinary Tract: The ureters are opacified with contrast material and decompressed, normal caliber. Urinary bladder filled with contrast and appears normal. Bowel: Normal appendix. Visualized large and small bowel unremarkable. Vascular/Lymphatic: Diffuse aortic and iliac calcifications. No aneurysm or adenopathy. Reproductive:  Uterus and adnexa unremarkable.  No mass.  Other:  No free fluid or free air. Musculoskeletal: Again noted are the acute fractures in the left inferior pubic ramus, left lateral superior pubic ramus near the left acetabulum, and left sacral ala. Old right sacral and right inferior pubic rami fractures again noted. Appearance is stable with compared to recent study. The previously seen hematoma in the left pelvis has nearly completely resolved. No new acute bony abnormality. IMPRESSION: Stable appearance of the acute fractures in the left sacrum, left lateral superior pubic ramus near the acetabulum, and left inferior pubic ramus fracture. Remote fractures in the right sacrum and right inferior pubic ramus. No new  bony abnormality. Near complete resolution of the left pelvic hematoma. Electronically Signed   By: Charlett Nose M.D.   On: 12/04/2017 09:31   Vas [place] Lower Extremity Venous (dvt)  Result Date: 12/04/2017  Lower Venous Study Indications: Swelling.  Risk Factors: Confirmed PE. Performing Technologist: Farrel Demark RDMS, RVT  Examination Guidelines: A complete evaluation includes B-mode imaging, spectral Doppler, color Doppler, and power Doppler as needed of all accessible portions of each vessel. Bilateral testing is considered an integral part of a complete examination. Limited examinations for reoccurring indications may be performed as noted.  Right Venous Findings: +---------+---------------+---------+-----------+----------+-------+          CompressibilityPhasicitySpontaneityPropertiesSummary +---------+---------------+---------+-----------+----------+-------+ CFV      Full           Yes      Yes                          +---------+---------------+---------+-----------+----------+-------+ SFJ      Full                                                 +---------+---------------+---------+-----------+----------+-------+ FV Prox  Full                                                 +---------+---------------+---------+-----------+----------+-------+ FV Mid   Full                                                 +---------+---------------+---------+-----------+----------+-------+ FV DistalFull                                                 +---------+---------------+---------+-----------+----------+-------+ PFV      Full                                                 +---------+---------------+---------+-----------+----------+-------+ POP      Full           Yes      Yes                          +---------+---------------+---------+-----------+----------+-------+ PTV  Full                                                  +---------+---------------+---------+-----------+----------+-------+ PERO     Full                                                 +---------+---------------+---------+-----------+----------+-------+ Soleal   None                                         Acute   +---------+---------------+---------+-----------+----------+-------+  Left Venous Findings: +---------+---------------+---------+-----------+----------+-------+          CompressibilityPhasicitySpontaneityPropertiesSummary +---------+---------------+---------+-----------+----------+-------+ CFV      Full           Yes      Yes                          +---------+---------------+---------+-----------+----------+-------+ SFJ      Full                                                 +---------+---------------+---------+-----------+----------+-------+ FV Prox  Full                                                 +---------+---------------+---------+-----------+----------+-------+ FV Mid   Full                                                 +---------+---------------+---------+-----------+----------+-------+ FV DistalFull                                                 +---------+---------------+---------+-----------+----------+-------+ PFV      Full                                                 +---------+---------------+---------+-----------+----------+-------+ POP      Full           Yes      Yes                          +---------+---------------+---------+-----------+----------+-------+ PTV      Full                                                 +---------+---------------+---------+-----------+----------+-------+ PERO  Full                                                 +---------+---------------+---------+-----------+----------+-------+ Soleal   None                                         Acute    +---------+---------------+---------+-----------+----------+-------+    Summary: Right: Findings consistent with acute deep vein thrombosis involving the right soleal vein. A cystic structure is found in the popliteal fossa. Left: Findings consistent with acute deep vein thrombosis involving the left soleal vein. No cystic structure found in the popliteal fossa.  *See table(s) above for measurements and observations. Electronically signed by Coral Else MD on 12/04/2017 at 10:34:27 AM.    Final      LOS: 1 day   Jeoffrey Massed, MD  [facility]  If 7PM-7AM, please contact night-coverage  Please page via [email or web address]-Password [email or web address]-click on MD name and type text message  12/05/2017, 9:17 AM

## 2017-12-05 NOTE — Progress Notes (Signed)
ANTICOAGULATION CONSULT NOTE  Pharmacy Consult for Heparin Indication: pulmonary embolus/DVT  No Known Allergies  Patient Measurements: Height: 5\' 5"  (165.1 cm) Weight: 148 lb 2.4 oz (67.2 kg) IBW/kg (Calculated) : 57  Heparin dosing weight - 67.2 kg  Vital Signs: Temp: 100.1 F (37.8 C) (11/28 0008) Temp Source: Oral (11/28 0008) BP: 112/59 (11/28 0008) Pulse Rate: 79 (11/28 0008)  [facility] WBC  20.6 Hgb  11.9 Hct  36.4 Plt  281  SCr  1.5 Recent Labs    12/04/17 0224 12/04/17 1034 12/04/17 1644 12/05/17 0019  HGB 10.3* 10.1*  --   --   HCT 34.0* 32.6*  --   --   PLT 251 241  --   --   HEPARINUNFRC  --  0.31 0.26* 0.26*  CREATININE 1.19* 0.95  --   --   TROPONINI 0.06* 0.04*  --   --     Estimated Creatinine Clearance: 35.4 mL/min (by C-G formula based on SCr of 0.95 mg/dL).  Assessment: 84 y.o. female with PE/DVT for heparin.   Goal of Therapy:  Heparin level 0.3-0.7 units/ml Monitor platelets by anticoagulation protocol: Yes   Plan:  Increase Heparin  1300 units/hr Follow-up am labs.   Geannie RisenGreg Square Jowett, PharmD, BCPS  12/05/2017 1:01 AM

## 2017-12-06 MED ORDER — APIXABAN 5 MG PO TABS: 10.0000 mg | ORAL_TABLET | Freq: Two times a day (BID) | ORAL | 0 refills | Status: AC

## 2017-12-06 MED ORDER — CEFDINIR 300 MG PO CAPS
300.0000 mg | ORAL_CAPSULE | Freq: Two times a day (BID) | ORAL | Status: DC
  Administered 2017-12-06: 300 mg via ORAL
  Filled 2017-12-06 (×2): qty 1

## 2017-12-06 MED ORDER — ACETAMINOPHEN 325 MG PO TABS: 650.0000 mg | ORAL_TABLET | Freq: Four times a day (QID) | ORAL | Status: AC | PRN

## 2017-12-06 MED ORDER — APIXABAN 5 MG PO TABS: 5.0000 mg | ORAL_TABLET | Freq: Two times a day (BID) | ORAL | 0 refills | Status: AC

## 2017-12-06 MED ORDER — CEFDINIR 300 MG PO CAPS: 300.0000 mg | ORAL_CAPSULE | Freq: Two times a day (BID) | ORAL | 0 refills | Status: AC

## 2017-12-06 NOTE — Discharge Summary (Addendum)
PATIENT DETAILS Name: Amanda Goodwin Age: 84 y.o. Sex: female Date of Birth: 1926/01/04 MRN: [id number]. Admitting Physician: Eduard Clos, MD FAO:[id number], Evlyn Courier., MD  Admit Date: 12/04/2017 Discharge date: 12/06/2017  Recommendations for Outpatient Follow-up:  1. Follow up with PCP in 1-2 weeks 2. Please obtain BMP/CBC in one week 3. Repeat CT Chest in 4 weeks-to assess if area of consolidation has resolved following antibiotic adminstration  Admitted From:  Home  Disposition: Home with home health services   Home Health: Yes  Equipment/Devices: None  Discharge Condition: Stable  CODE STATUS: FULL CODE  Diet recommendation:  Heart Healthy   Brief Summary: See H&P, Labs, Consult and Test reports for all details in brief,Patient is a 84 y.o. female history of dementia, recent left acetabular fracture managed conservatively-presenting with shortness of breath, found to have a pulmonary embolism-started on anticoagulation admitted to the hospitalist service.  See below for further details  Brief Hospital Course: Venous thromboembolism-PE with bilateral lower extremity DVT: Likely provoked-probably secondary to decreased mobility following left acetabular fracture.  Although CT angiogram of the chest shows possible RV strain-patient has no clinical features of RV failure.  2D echocardiogram does not show RV failure as well.  Lower extremity Dopplers does confirm bilateral lower extremity DVT.    Initially on IV heparin-but has been transition to Eliquis.  Continue Eliquis and follow with PCP to determine long-term duration as this is a provoked pulmonary embolism.  Spoke at length with patient's niece over the phone-she is aware of the risk of anticoagulation.  Community-acquired pneumonia: Afebrile-leukocytosis has resolved-continue Rocephin/Zithromax-we will switch to oral antimicrobial agents on discharge.  Influenza PCR negative.  This could be pulmonary  infarction as well.  Mild AKI: Likely hemodynamically mediated-resolved.  Minimally elevated troponin: Trend is flat and not suggestive of ACS.  Likely secondary to PE.  No further work-up required  Recent left acetabular fracture/left pubic fracture: Supportive care-ambulate with physical therapy.    Advanced dementia: Pleasantly confused this morning-but able to answer most of my questions appropriately.  Chronic deconditioning/debility: Per family and patient-do not want to consider SNF-wants to go home with home health services-which has been ordered.  Procedures/Studies: None  Discharge Diagnoses:  Principal Problem:   Acute pulmonary embolism (HCC) Active Problems:   CAP (community acquired pneumonia)   Hip pain   Normocytic normochromic anemia   Dementia (HCC)   Acute pulmonary embolus [facility])   Discharge Instructions:  Activity:  As tolerated with Full fall precautions use walker/cane & assistance as needed  Discharge Instructions    Call MD for:  difficulty breathing, headache or visual disturbances   Complete by:  As directed    Diet - low sodium heart healthy   Complete by:  As directed    Discharge instructions   Complete by:  As directed    Follow with Primary MD  Gordan Payment., MD in 1 week  Please ask Dr Shary Decamp to perform a CT Chest in 4 weeks- to assess right lower lobe lung consolidation after completing antibiotic course  Please get a complete blood count and chemistry panel checked by your Primary MD at your next visit, and again as instructed by your Primary MD.  Get Medicines reviewed and adjusted: Please take all your medications with you for your next visit with your Primary MD  Laboratory/radiological data: Please request your Primary MD to go over all hospital tests and procedure/radiological results at the follow up, please ask your Primary MD to get  all Hospital records sent to his/her office.  In some cases, they will be blood work,  cultures and biopsy results pending at the time of your discharge. Please request that your primary care M.D. follows up on these results.  Also Note the following: If you experience worsening of your admission symptoms, develop shortness of breath, life threatening emergency, suicidal or homicidal thoughts you must seek medical attention immediately by calling 911 or calling your MD immediately  if symptoms less severe.  You must read complete instructions/literature along with all the possible adverse reactions/side effects for all the Medicines you take and that have been prescribed to you. Take any new Medicines after you have completely understood and accpet all the possible adverse reactions/side effects.   Do not drive when taking Pain medications or sleeping medications (Benzodaizepines)  Do not take more than prescribed Pain, Sleep and Anxiety Medications. It is not advisable to combine anxiety,sleep and pain medications without talking with your primary care practitioner  Special Instructions: If you have smoked or chewed Tobacco  in the last 2 yrs please stop smoking, stop any regular Alcohol  and or any Recreational drug use.  Wear Seat belts while driving.  Please note: You were cared for by a hospitalist during your hospital stay. Once you are discharged, your primary care physician will handle any further medical issues. Please note that NO REFILLS for any discharge medications will be authorized once you are discharged, as it is imperative that you return to your primary care physician (or establish a relationship with a primary care physician if you do not have one) for your post hospital discharge needs so that they can reassess your need for medications and monitor your lab values.   Increase activity slowly   Complete by:  As directed      Allergies as of 12/06/2017   No Known Allergies     Medication List    STOP taking these medications   aspirin EC 81 MG tablet     ibuprofen 200 MG tablet Commonly known as:  ADVIL,MOTRIN     TAKE these medications   acetaminophen 325 MG tablet Commonly known as:  TYLENOL Take 2 tablets (650 mg total) by mouth every 6 (six) hours as needed for mild pain (or Fever >/= 101). What changed:    medication strength  how much to take  when to take this  reasons to take this   apixaban 5 MG Tabs tablet Commonly known as:  ELIQUIS Take 2 tablets (10 mg total) by mouth 2 (two) times daily. Last dose on 12/4 evening Notes to patient:  This order ends on 12/11/2017   apixaban 5 MG Tabs tablet Commonly known as:  ELIQUIS Take 1 tablet (5 mg total) by mouth 2 (two) times daily. Start on 12/5 morning only Start taking on:  12/12/2017   cefdinir 300 MG capsule Commonly known as:  OMNICEF Take 1 capsule (300 mg total) by mouth every 12 (twelve) hours.   omeprazole 40 MG capsule Commonly known as:  PRILOSEC Take 40 mg by mouth daily.   pregabalin 100 MG capsule Commonly known as:  LYRICA Take 100 mg by mouth 2 (two) times daily.      Follow-up Information    Gordan PaymentGrisso, Greg A., MD. Schedule an appointment as soon as possible for a visit in 1 week(s).   Specialty:  Internal Medicine Contact information: [address] [place] [id number] [phone]        [facility] Follow up.   Specialty:  Home Health Services Why:  They will do your home health care at your home Contact information: [address]  [place] [address] [phone]          No Known Allergies   Consultations:   None   Other Procedures/Studies: Ct Pelvis Wo Contrast  Result Date: 12/04/2017 CLINICAL DATA:  Pelvic fractures, pelvic pain EXAM: CT PELVIS WITHOUT CONTRAST TECHNIQUE: Multidetector CT imaging of the pelvis was performed following the standard protocol without intravenous contrast. COMPARISON:  11/19/2017 FINDINGS: Urinary Tract: The ureters are opacified with contrast material and  decompressed, normal caliber. Urinary bladder filled with contrast and appears normal. Bowel: Normal appendix. Visualized large and small bowel unremarkable. Vascular/Lymphatic: Diffuse aortic and iliac calcifications. No aneurysm or adenopathy. Reproductive:  Uterus and adnexa unremarkable.  No mass. Other:  No free fluid or free air. Musculoskeletal: Again noted are the acute fractures in the left inferior pubic ramus, left lateral superior pubic ramus near the left acetabulum, and left sacral ala. Old right sacral and right inferior pubic rami fractures again noted. Appearance is stable with compared to recent study. The previously seen hematoma in the left pelvis has nearly completely resolved. No new acute bony abnormality. IMPRESSION: Stable appearance of the acute fractures in the left sacrum, left lateral superior pubic ramus near the acetabulum, and left inferior pubic ramus fracture. Remote fractures in the right sacrum and right inferior pubic ramus. No new bony abnormality. Near complete resolution of the left pelvic hematoma. Electronically Signed   By: Charlett Nose M.D.   On: 12/04/2017 09:31   Vas [place] Lower Extremity Venous (dvt)  Result Date: 12/04/2017  Lower Venous Study Indications: Swelling.  Risk Factors: Confirmed PE. Performing Technologist: Farrel Demark RDMS, RVT  Examination Guidelines: A complete evaluation includes B-mode imaging, spectral Doppler, color Doppler, and power Doppler as needed of all accessible portions of each vessel. Bilateral testing is considered an integral part of a complete examination. Limited examinations for reoccurring indications may be performed as noted.  Right Venous Findings: +---------+---------------+---------+-----------+----------+-------+          CompressibilityPhasicitySpontaneityPropertiesSummary +---------+---------------+---------+-----------+----------+-------+ CFV      Full           Yes      Yes                           +---------+---------------+---------+-----------+----------+-------+ SFJ      Full                                                 +---------+---------------+---------+-----------+----------+-------+ FV Prox  Full                                                 +---------+---------------+---------+-----------+----------+-------+ FV Mid   Full                                                 +---------+---------------+---------+-----------+----------+-------+ FV DistalFull                                                 +---------+---------------+---------+-----------+----------+-------+  PFV      Full                                                 +---------+---------------+---------+-----------+----------+-------+ POP      Full           Yes      Yes                          +---------+---------------+---------+-----------+----------+-------+ PTV      Full                                                 +---------+---------------+---------+-----------+----------+-------+ PERO     Full                                                 +---------+---------------+---------+-----------+----------+-------+ Soleal   None                                         Acute   +---------+---------------+---------+-----------+----------+-------+  Left Venous Findings: +---------+---------------+---------+-----------+----------+-------+          CompressibilityPhasicitySpontaneityPropertiesSummary +---------+---------------+---------+-----------+----------+-------+ CFV      Full           Yes      Yes                          +---------+---------------+---------+-----------+----------+-------+ SFJ      Full                                                 +---------+---------------+---------+-----------+----------+-------+ FV Prox  Full                                                  +---------+---------------+---------+-----------+----------+-------+ FV Mid   Full                                                 +---------+---------------+---------+-----------+----------+-------+ FV DistalFull                                                 +---------+---------------+---------+-----------+----------+-------+ PFV      Full                                                 +---------+---------------+---------+-----------+----------+-------+  POP      Full           Yes      Yes                          +---------+---------------+---------+-----------+----------+-------+ PTV      Full                                                 +---------+---------------+---------+-----------+----------+-------+ PERO     Full                                                 +---------+---------------+---------+-----------+----------+-------+ Soleal   None                                         Acute   +---------+---------------+---------+-----------+----------+-------+    Summary: Right: Findings consistent with acute deep vein thrombosis involving the right soleal vein. A cystic structure is found in the popliteal fossa. Left: Findings consistent with acute deep vein thrombosis involving the left soleal vein. No cystic structure found in the popliteal fossa.  *See table(s) above for measurements and observations. Electronically signed by Coral Else MD on 12/04/2017 at 10:34:27 AM.    Final      TODAY-DAY OF DISCHARGE:  Subjective:   Zorita Pang today has no headache,no chest abdominal pain,no new weakness tingling or numbness, feels much better wants to go home today.   Objective:   Blood pressure (!) 168/75, pulse 79, temperature (!) 97.4 F (36.3 C), temperature source Oral, resp. rate 16, height 5\' 5"  (1.651 m), weight 67.6 kg, SpO2 97 %.  Intake/Output Summary (Last 24 hours) at 12/06/2017 1301 Last data filed at 12/06/2017 0530 Gross  per 24 hour  Intake 950 ml  Output 900 ml  Net 50 ml   Filed Weights   12/04/17 0149 12/05/17 0418  Weight: 67.2 kg 67.6 kg    Exam: Awake Alert, Oriented *3, No new F.N deficits, Normal affect [place].AT,PERRAL Supple Neck,No JVD, No cervical lymphadenopathy appriciated.  Symmetrical Chest wall movement, Good air movement bilaterally, CTAB RRR,No Gallops,Rubs or new Murmurs, No Parasternal Heave +ve B.Sounds, Abd Soft, Non tender, No organomegaly appriciated, No rebound -guarding or rigidity. No Cyanosis, Clubbing or edema, No new Rash or bruise   PERTINENT RADIOLOGIC STUDIES: Ct Pelvis Wo Contrast  Result Date: 12/04/2017 CLINICAL DATA:  Pelvic fractures, pelvic pain EXAM: CT PELVIS WITHOUT CONTRAST TECHNIQUE: Multidetector CT imaging of the pelvis was performed following the standard protocol without intravenous contrast. COMPARISON:  11/19/2017 FINDINGS: Urinary Tract: The ureters are opacified with contrast material and decompressed, normal caliber. Urinary bladder filled with contrast and appears normal. Bowel: Normal appendix. Visualized large and small bowel unremarkable. Vascular/Lymphatic: Diffuse aortic and iliac calcifications. No aneurysm or adenopathy. Reproductive:  Uterus and adnexa unremarkable.  No mass. Other:  No free fluid or free air. Musculoskeletal: Again noted are the acute fractures in the left inferior pubic ramus, left lateral superior pubic ramus near the left acetabulum, and left sacral ala. Old right sacral and right inferior pubic rami fractures again noted. Appearance is  stable with compared to recent study. The previously seen hematoma in the left pelvis has nearly completely resolved. No new acute bony abnormality. IMPRESSION: Stable appearance of the acute fractures in the left sacrum, left lateral superior pubic ramus near the acetabulum, and left inferior pubic ramus fracture. Remote fractures in the right sacrum and right inferior pubic ramus. No new bony  abnormality. Near complete resolution of the left pelvic hematoma. Electronically Signed   By: Charlett Nose M.D.   On: 12/04/2017 09:31   Vas [place] Lower Extremity Venous (dvt)  Result Date: 12/04/2017  Lower Venous Study Indications: Swelling.  Risk Factors: Confirmed PE. Performing Technologist: Farrel Demark RDMS, RVT  Examination Guidelines: A complete evaluation includes B-mode imaging, spectral Doppler, color Doppler, and power Doppler as needed of all accessible portions of each vessel. Bilateral testing is considered an integral part of a complete examination. Limited examinations for reoccurring indications may be performed as noted.  Right Venous Findings: +---------+---------------+---------+-----------+----------+-------+          CompressibilityPhasicitySpontaneityPropertiesSummary +---------+---------------+---------+-----------+----------+-------+ CFV      Full           Yes      Yes                          +---------+---------------+---------+-----------+----------+-------+ SFJ      Full                                                 +---------+---------------+---------+-----------+----------+-------+ FV Prox  Full                                                 +---------+---------------+---------+-----------+----------+-------+ FV Mid   Full                                                 +---------+---------------+---------+-----------+----------+-------+ FV DistalFull                                                 +---------+---------------+---------+-----------+----------+-------+ PFV      Full                                                 +---------+---------------+---------+-----------+----------+-------+ POP      Full           Yes      Yes                          +---------+---------------+---------+-----------+----------+-------+ PTV      Full                                                  +---------+---------------+---------+-----------+----------+-------+  PERO     Full                                                 +---------+---------------+---------+-----------+----------+-------+ Soleal   None                                         Acute   +---------+---------------+---------+-----------+----------+-------+  Left Venous Findings: +---------+---------------+---------+-----------+----------+-------+          CompressibilityPhasicitySpontaneityPropertiesSummary +---------+---------------+---------+-----------+----------+-------+ CFV      Full           Yes      Yes                          +---------+---------------+---------+-----------+----------+-------+ SFJ      Full                                                 +---------+---------------+---------+-----------+----------+-------+ FV Prox  Full                                                 +---------+---------------+---------+-----------+----------+-------+ FV Mid   Full                                                 +---------+---------------+---------+-----------+----------+-------+ FV DistalFull                                                 +---------+---------------+---------+-----------+----------+-------+ PFV      Full                                                 +---------+---------------+---------+-----------+----------+-------+ POP      Full           Yes      Yes                          +---------+---------------+---------+-----------+----------+-------+ PTV      Full                                                 +---------+---------------+---------+-----------+----------+-------+ PERO     Full                                                 +---------+---------------+---------+-----------+----------+-------+  Soleal   None                                         Acute    +---------+---------------+---------+-----------+----------+-------+    Summary: Right: Findings consistent with acute deep vein thrombosis involving the right soleal vein. A cystic structure is found in the popliteal fossa. Left: Findings consistent with acute deep vein thrombosis involving the left soleal vein. No cystic structure found in the popliteal fossa.  *See table(s) above for measurements and observations. Electronically signed by Coral Else MD on 12/04/2017 at 10:34:27 AM.    Final      PERTINENT LAB RESULTS: CBC: Recent Labs    12/04/17 1034 12/05/17 0717  WBC 17.4* 7.5  HGB 10.1* 9.7*  HCT 32.6* 30.2*  PLT 241 229   CMET CMP     Component Value Date/Time   NA 139 12/04/2017 1034   K 3.6 12/04/2017 1034   CL 106 12/04/2017 1034   CO2 25 12/04/2017 1034   GLUCOSE 94 12/04/2017 1034   BUN 13 12/04/2017 1034   CREATININE 0.95 12/04/2017 1034   CALCIUM 8.4 (L) 12/04/2017 1034   PROT 5.9 (L) 12/04/2017 0224   ALBUMIN 2.8 (L) 12/04/2017 0224   AST 29 12/04/2017 0224   ALT 11 12/04/2017 0224   ALKPHOS 172 (H) 12/04/2017 0224   BILITOT 0.9 12/04/2017 0224   GFRNONAA 53 (L) 12/04/2017 1034   GFRAA >60 12/04/2017 1034    GFR Estimated Creatinine Clearance: 35.4 mL/min (by C-G formula based on SCr of 0.95 mg/dL). No results for input(s): LIPASE, AMYLASE in the last 72 hours. Recent Labs    12/04/17 0224 12/04/17 1034  TROPONINI 0.06* 0.04*   Invalid input(s): POCBNP No results for input(s): DDIMER in the last 72 hours. No results for input(s): HGBA1C in the last 72 hours. No results for input(s): CHOL, HDL, LDLCALC, TRIG, CHOLHDL, LDLDIRECT in the last 72 hours. No results for input(s): TSH, T4TOTAL, T3FREE, THYROIDAB in the last 72 hours.  Invalid input(s): FREET3 No results for input(s): VITAMINB12, FOLATE, FERRITIN, TIBC, IRON, RETICCTPCT in the last 72 hours. Coags: No results for input(s): INR in the last 72 hours.  Invalid input(s):  PT Microbiology: Recent Results (from the past 240 hour(s))  MRSA PCR Screening     Status: None   Collection Time: 12/04/17  2:15 AM  Result Value Ref Range Status   MRSA by PCR NEGATIVE NEGATIVE Final    Comment:        The GeneXpert MRSA Assay (FDA approved for NASAL specimens only), is one component of a comprehensive MRSA colonization surveillance program. It is not intended to diagnose MRSA infection nor to guide or monitor treatment for MRSA infections. Performed at [facility], [address], [place], [place] [address]     FURTHER DISCHARGE INSTRUCTIONS:  Get Medicines reviewed and adjusted: Please take all your medications with you for your next visit with your Primary MD  Laboratory/radiological data: Please request your Primary MD to go over all hospital tests and procedure/radiological results at the follow up, please ask your Primary MD to get all Hospital records sent to his/her office.  In some cases, they will be blood work, cultures and biopsy results pending at the time of your discharge. Please request that your primary care M.D. goes through all the records of your hospital data and follows up on these  results.  Also Note the following: If you experience worsening of your admission symptoms, develop shortness of breath, life threatening emergency, suicidal or homicidal thoughts you must seek medical attention immediately by calling 911 or calling your MD immediately  if symptoms less severe.  You must read complete instructions/literature along with all the possible adverse reactions/side effects for all the Medicines you take and that have been prescribed to you. Take any new Medicines after you have completely understood and accpet all the possible adverse reactions/side effects.   Do not drive when taking Pain medications or sleeping medications (Benzodaizepines)  Do not take more than prescribed Pain, Sleep and Anxiety Medications. It is not  advisable to combine anxiety,sleep and pain medications without talking with your primary care practitioner  Special Instructions: If you have smoked or chewed Tobacco  in the last 2 yrs please stop smoking, stop any regular Alcohol  and or any Recreational drug use.  Wear Seat belts while driving.  Please note: You were cared for by a hospitalist during your hospital stay. Once you are discharged, your primary care physician will handle any further medical issues. Please note that NO REFILLS for any discharge medications will be authorized once you are discharged, as it is imperative that you return to your primary care physician (or establish a relationship with a primary care physician if you do not have one) for your post hospital discharge needs so that they can reassess your need for medications and monitor your lab values.  Total Time spent coordinating discharge including counseling, education and face to face time equals  45 minutes.  Signed: Rehema Muffley 12/06/2017 1:01 PM

## 2017-12-06 NOTE — Progress Notes (Signed)
CM talked to pt niece Joyce GrossKay who is the care giver for the [facility] for [facility], [facility]; referral placed as requested; New Eliquis, coupon card given to patient with explanation of usage; Alexis GoodellB Aliena Ghrist RN,MHA,BSN [phone]

## 2017-12-06 NOTE — Discharge Instructions (Addendum)
Information on my medicine - ELIQUIS (apixaban)  This medication education was reviewed with me or my healthcare representative as part of my discharge preparation.    Why was Eliquis prescribed for you? Eliquis was prescribed to treat blood clots that may have been found in the veins of your lungs (pulmonary embolism) or in your legs (deep vein thrombosis) and to reduce the risk of them occurring again.  What do You need to know about Eliquis ? The starting dose is 10 mg (two 5 mg tablets) taken TWICE daily for the FIRST SEVEN (7) DAYS, then on 12/12/17  the dose is reduced to ONE 5 mg tablet taken TWICE daily.  Eliquis may be taken with or without food.   Try to take the dose about the same time in the morning and in the evening. If you have difficulty swallowing the tablet whole please discuss with your pharmacist how to take the medication safely.  Take Eliquis exactly as prescribed and DO NOT stop taking Eliquis without talking to the doctor who prescribed the medication.  Stopping may increase your risk of developing a new blood clot.  Refill your prescription before you run out.  After discharge, you should have regular check-up appointments with your healthcare provider that is prescribing your Eliquis.    What do you do if you miss a dose? If a dose of ELIQUIS is not taken at the scheduled time, take it as soon as possible on the same day and twice-daily administration should be resumed. The dose should not be doubled to make up for a missed dose.  Important Safety Information A possible side effect of Eliquis is bleeding. You should call your healthcare provider right away if you experience any of the following: ? Bleeding from an injury or your nose that does not stop. ? Unusual colored urine (red or dark brown) or unusual colored stools (red or black). ? Unusual bruising for unknown reasons. ? A serious fall or if you hit your head (even if there is no bleeding).  Some  medicines may interact with Eliquis and might increase your risk of bleeding or clotting while on Eliquis. To help avoid this, consult your healthcare provider or pharmacist prior to using any new prescription or non-prescription medications, including herbals, vitamins, non-steroidal anti-inflammatory drugs (NSAIDs) and supplements.  This website has more information on Eliquis (apixaban): http://[email or web address].com/eliquis/home

## 2017-12-06 NOTE — Evaluation (Signed)
Physical Therapy Evaluation Patient Details Name: Amanda Goodwin MRN: [id number] DOB: 13-Jun-1925 Today's Date: 12/06/2017   History of Present Illness  Patient is a 84 y.o. female history of dementia, recent left acetabular fracture managed conservatively-presenting with shortness of breath, found to have a pulmonary embolism-started on anticoagulation admitted to the hospitalist service    Clinical Impression  Patient evaluated by Physical Therapy with no further acute PT needs identified. All education has been completed and the patient has no further questions. PTA pt with limited household ambulation, mostly w/c, and care from family. Niece present and states she will never allow patient to go to a SNF and provides 24 hour care. Discussed safety consideration nd benefits of HHPT to reduce risk of falling at home.  See below for any follow-up Physical Therapy or equipment needs. PT is signing off. Thank you for this referral.     Follow Up Recommendations Home health PT;Supervision/Assistance - 24 hour    Equipment Recommendations  None recommended by PT    Recommendations for Other Services       Precautions / Restrictions Restrictions Weight Bearing Restrictions: No      Mobility  Bed Mobility Overal bed mobility: Needs Assistance Bed Mobility: Supine to Sit     Supine to sit: Min assist        Transfers Overall transfer level: Needs assistance   Transfers: Sit to/from Stand Sit to Stand: Min guard         General transfer comment: min guard to stand  Ambulation/Gait                Stairs            Wheelchair Mobility    Modified Rankin (Stroke Patients Only)       Balance                                             Pertinent Vitals/Pain Pain Assessment: No/denies pain    Home Living Family/patient expects to be discharged to:: Private residence Living Arrangements: Other relatives;Other (Comment) Available  Help at Discharge: Family;Available 24 hours/day Type of Home: House Home Access: Ramped entrance     Home Layout: One level        Prior Function                 Hand Dominance        Extremity/Trunk Assessment   Upper Extremity Assessment Upper Extremity Assessment: Overall WFL for tasks assessed    Lower Extremity Assessment Lower Extremity Assessment: Overall WFL for tasks assessed       Communication      Cognition Arousal/Alertness: Awake/alert   Overall Cognitive Status: History of cognitive impairments - at baseline                                 General Comments: pleasently confused. family with 24 supervision due to dementia.       General Comments      Exercises     Assessment/Plan    PT Assessment All further PT needs can be met in the next venue of care  PT Problem List Decreased cognition;Decreased activity tolerance       PT Treatment Interventions      PT Goals (Current goals can be found in the Care  Plan section)  Acute Rehab PT Goals Patient Stated Goal: go home PT Goal Formulation: With patient/family Potential to Achieve Goals: Good    Frequency     Barriers to discharge        Co-evaluation               AM-PAC PT "6 Clicks" Mobility  Outcome Measure Help needed turning from your back to your side while in a flat bed without using bedrails?: A Little Help needed moving from lying on your back to sitting on the side of a flat bed without using bedrails?: A Little Help needed moving to and from a bed to a chair (including a wheelchair)?: A Little Help needed standing up from a chair using your arms (e.g., wheelchair or bedside chair)?: A Little Help needed to walk in hospital room?: A Lot Help needed climbing 3-5 steps with a railing? : A Lot 6 Click Score: 16    End of Session Equipment Utilized During Treatment: Gait belt Activity Tolerance: Patient tolerated treatment well Patient left: in  bed;with call bell/phone within reach Nurse Communication: Mobility status PT Visit Diagnosis: History of falling (Z91.81);Unsteadiness on feet (R26.81)    Time: 5041-3643 PT Time Calculation (min) (ACUTE ONLY): 16 min   Charges:   PT Evaluation $PT Eval Moderate Complexity: 1 Mod          Reinaldo Berber, PT, DPT Acute Rehabilitation Services Pager: [phone] Office: [phone]    Reinaldo Berber 12/06/2017, 1:10 PM

## 2017-12-06 NOTE — Progress Notes (Signed)
Pt has been discharged per physician orders to home with niece, Chaya JanKay Stanley, and family. Teaching and discharge instructions have been completed, no further information needed at this time. Pt left via wheelchair with family.

## 2018-02-16 DIAGNOSIS — J189 Pneumonia, unspecified organism: Secondary | ICD-10-CM | POA: Diagnosis not present

## 2018-02-16 DIAGNOSIS — E876 Hypokalemia: Secondary | ICD-10-CM

## 2018-02-16 DIAGNOSIS — J45909 Unspecified asthma, uncomplicated: Secondary | ICD-10-CM

## 2018-02-16 DIAGNOSIS — N179 Acute kidney failure, unspecified: Secondary | ICD-10-CM | POA: Diagnosis not present

## 2018-02-17 DIAGNOSIS — N179 Acute kidney failure, unspecified: Secondary | ICD-10-CM | POA: Diagnosis not present

## 2018-02-17 DIAGNOSIS — J45909 Unspecified asthma, uncomplicated: Secondary | ICD-10-CM | POA: Diagnosis not present

## 2018-02-17 DIAGNOSIS — J189 Pneumonia, unspecified organism: Secondary | ICD-10-CM | POA: Diagnosis not present

## 2018-02-17 DIAGNOSIS — E876 Hypokalemia: Secondary | ICD-10-CM | POA: Diagnosis not present

## 2019-03-27 IMAGING — CT CT PELVIS W/O CM
2 of 3 series · 16 of 46 positions shown, 18 images · non-contrast
Comparison: 11/19/2017

CLINICAL DATA: Pelvic fractures, pelvic pain

EXAM:
CT PELVIS WITHOUT CONTRAST
TECHNIQUE: Multidetector CT imaging of the pelvis was performed following the
standard protocol without intravenous contrast.

[Series 4: pelvis 2.0 st · axial · 0.98mm/px · z∈[+840,+1066]mm · 13 of 131 slices shown, 15 images]
[im 9/131  soft-tissue]
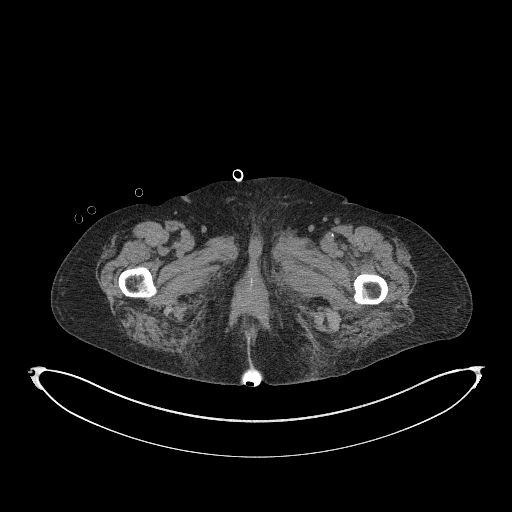
[im 9/131  bone]
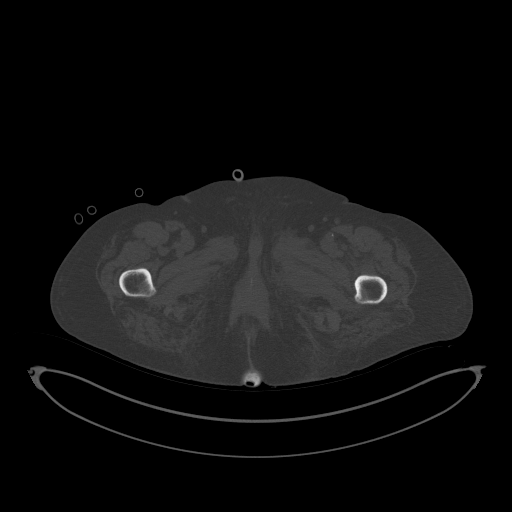
[im 17/131  soft-tissue]
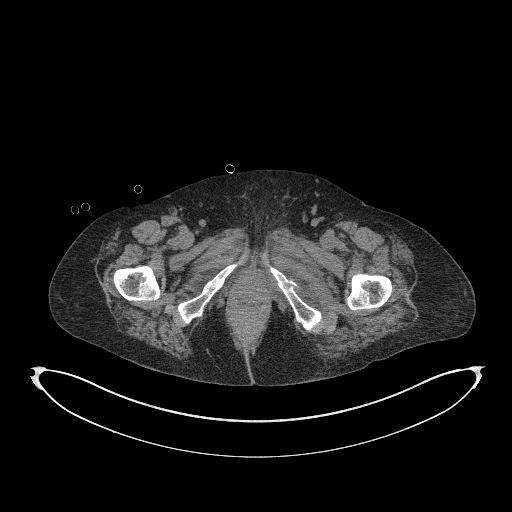
[im 26/131  soft-tissue]
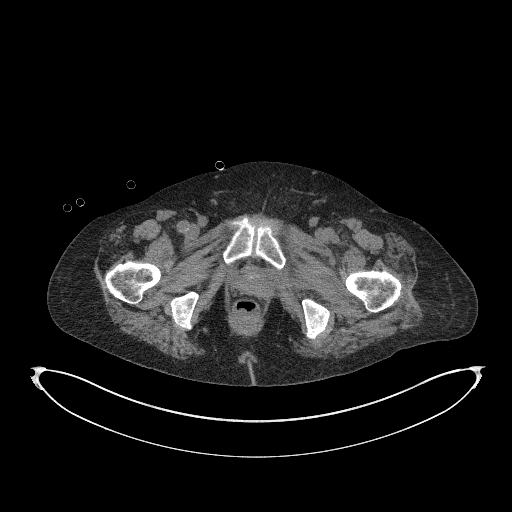
[im 38/131  soft-tissue]
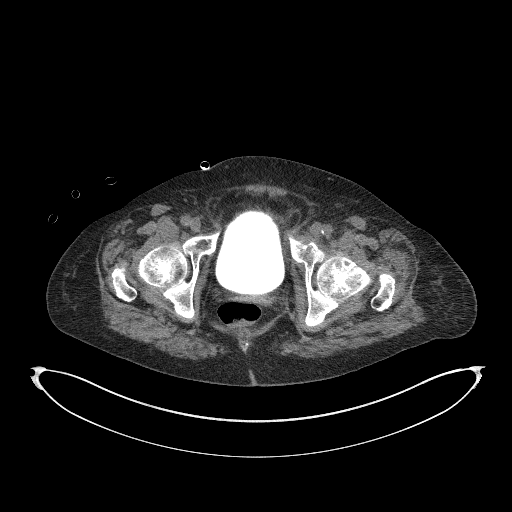
[im 47/131  soft-tissue]
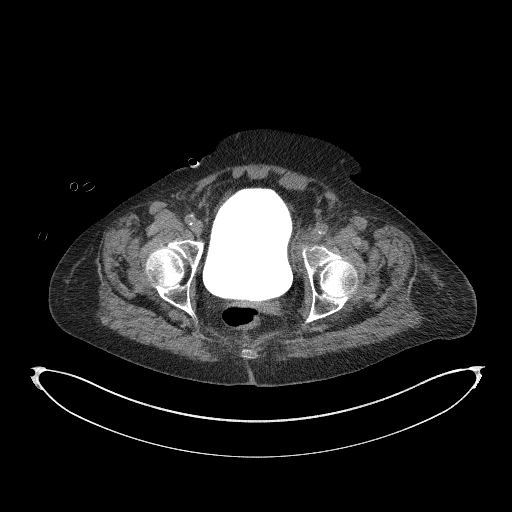
[im 55/131  soft-tissue]
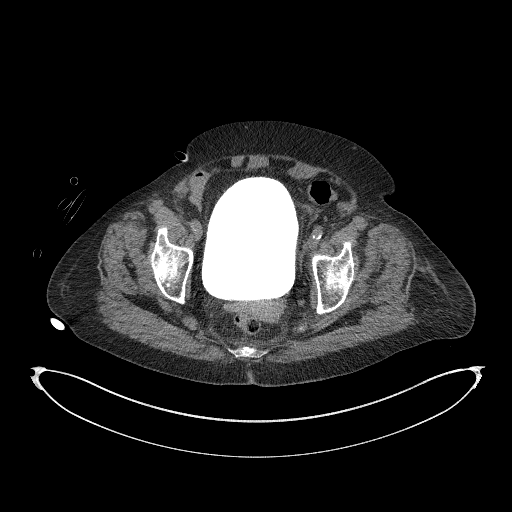
[im 68/131  soft-tissue]
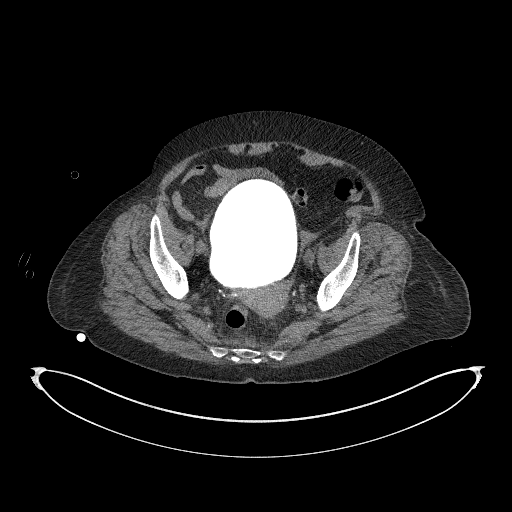
[im 76/131  soft-tissue]
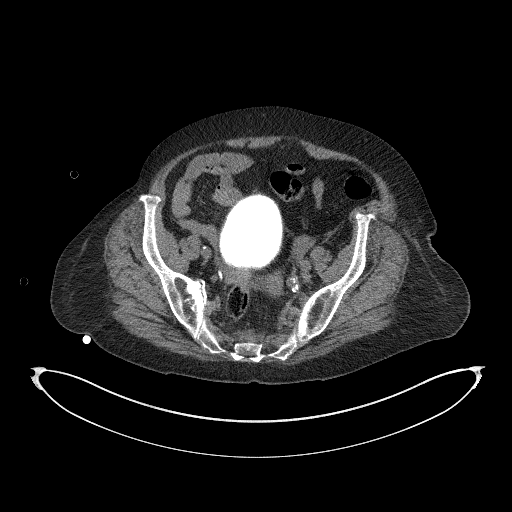
[im 84/131  soft-tissue]
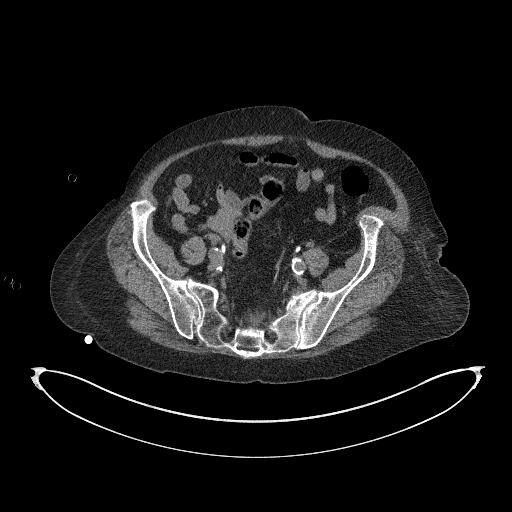
[im 84/131  bone]
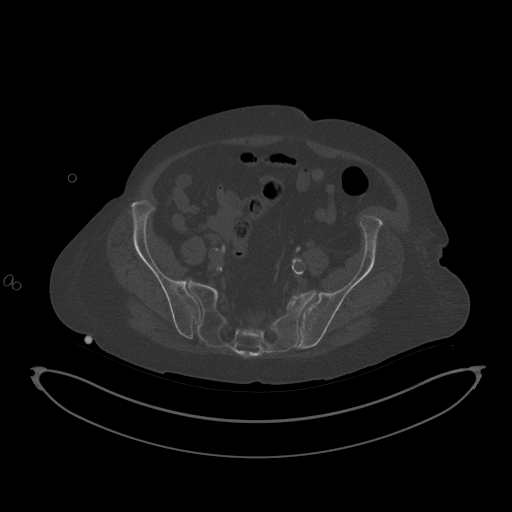
[im 93/131  soft-tissue]
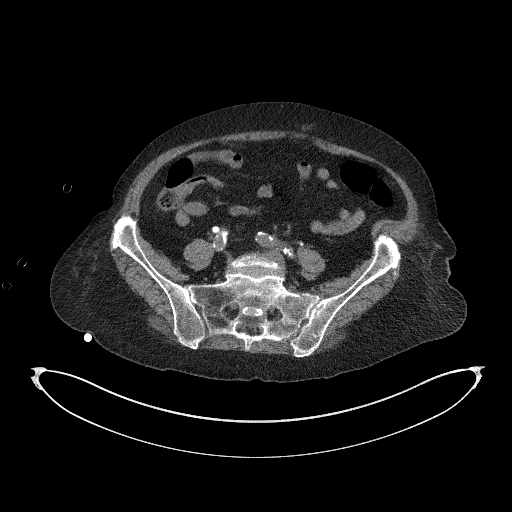
[im 105/131  soft-tissue]
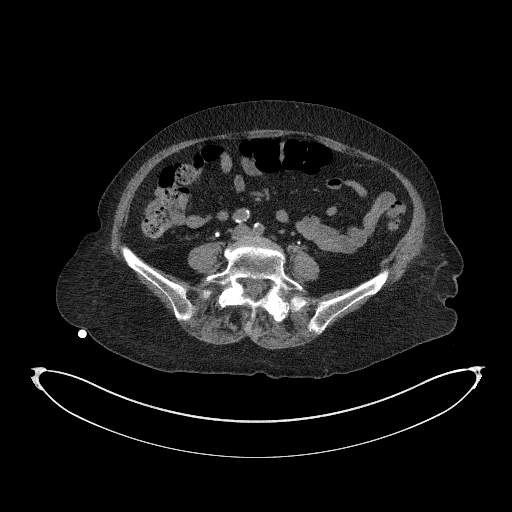
[im 114/131  soft-tissue]
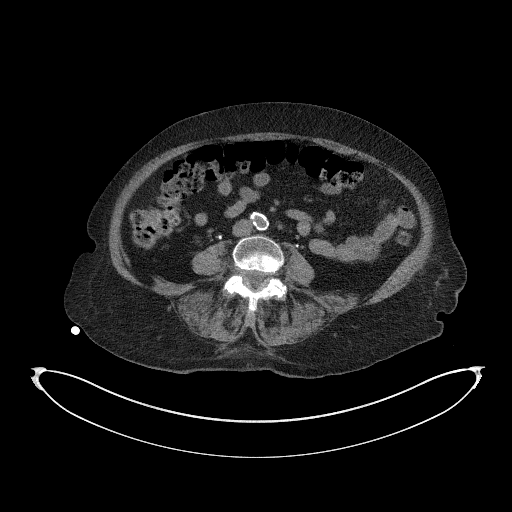
[im 122/131  soft-tissue]
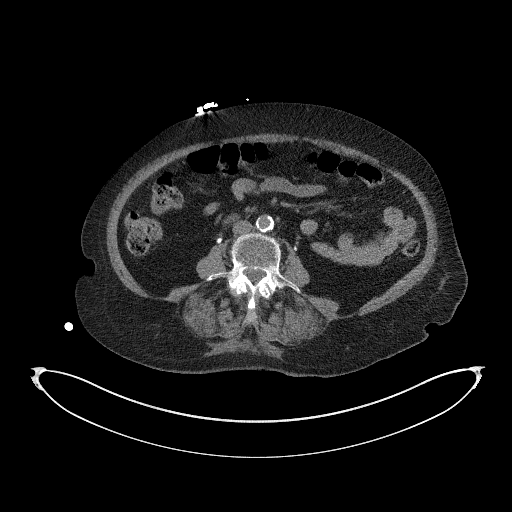

[Series 9: coronal st · coronal · 0.52mm/px · 3 of 187 slices shown]
[im 63/187  soft-tissue]
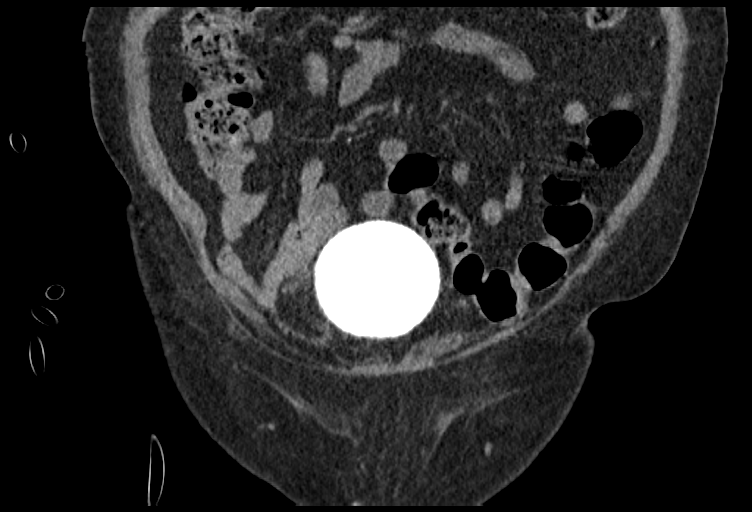
[im 83/187  soft-tissue]
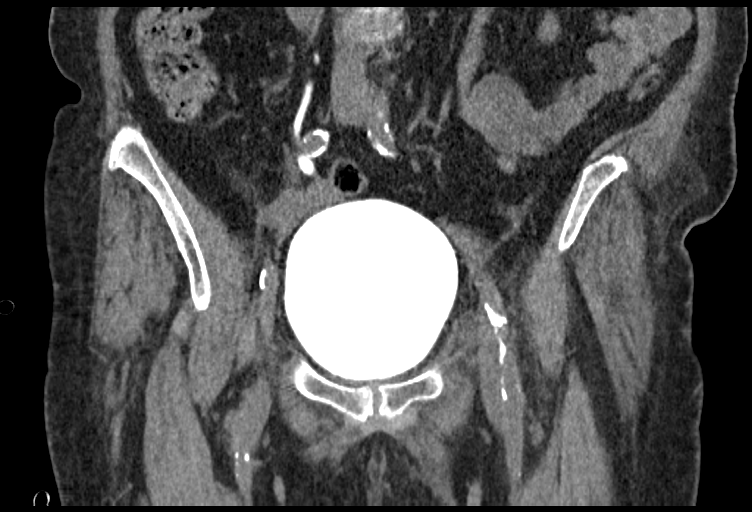
[im 104/187  soft-tissue]
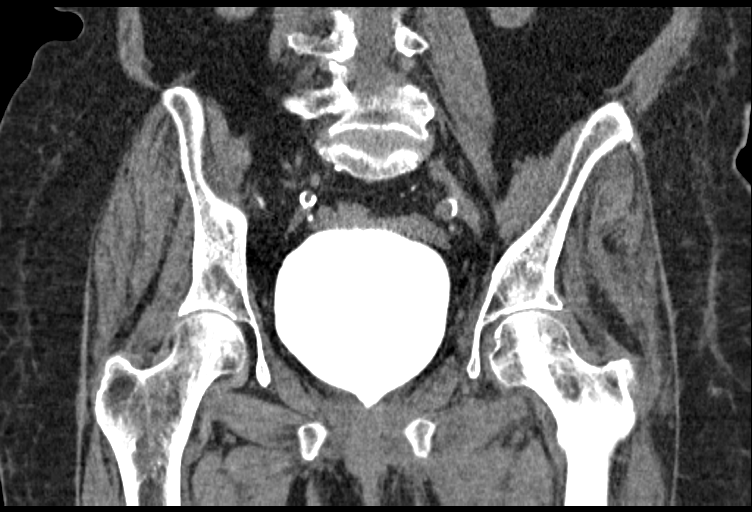

[16 of 46 positions shown; findings below may reference images not displayed]

FINDINGS: Urinary Tract: The ureters are opacified with contrast material and
decompressed, normal caliber. Urinary bladder filled with contrast
and appears normal.

Bowel: Normal appendix. Visualized large and small bowel
unremarkable.

Vascular/Lymphatic: Diffuse aortic and iliac calcifications. No
aneurysm or adenopathy.

Reproductive:  Uterus and adnexa unremarkable.  No mass.

Other:  No free fluid or free air.

Musculoskeletal: Again noted are the acute fractures in the left
inferior pubic ramus, left lateral superior pubic ramus near the
left acetabulum, and left sacral ala. Old right sacral and right
inferior pubic rami fractures again noted. Appearance is stable with
compared to recent study. The previously seen hematoma in the left
pelvis has nearly completely resolved. No new acute bony
abnormality.
IMPRESSION: Stable appearance of the acute fractures in the left sacrum, left
lateral superior pubic ramus near the acetabulum, and left inferior
pubic ramus fracture.

Remote fractures in the right sacrum and right inferior pubic ramus.

No new bony abnormality.

Near complete resolution of the left pelvic hematoma.
